# Patient Record
Sex: Female | Born: 1970 | Race: White | Hispanic: No | Smoking: Current every day smoker
Health system: Southern US, Community
[De-identification: ages and names within clinical notes are randomized; demographics above are authoritative.]

## PROBLEM LIST (undated history)

## (undated) DIAGNOSIS — F419 Anxiety disorder, unspecified: Secondary | ICD-10-CM

## (undated) DIAGNOSIS — M1990 Unspecified osteoarthritis, unspecified site: Secondary | ICD-10-CM

## (undated) DIAGNOSIS — F332 Major depressive disorder, recurrent severe without psychotic features: Secondary | ICD-10-CM

## (undated) DIAGNOSIS — A4902 Methicillin resistant Staphylococcus aureus infection, unspecified site: Secondary | ICD-10-CM

## (undated) DIAGNOSIS — F988 Other specified behavioral and emotional disorders with onset usually occurring in childhood and adolescence: Secondary | ICD-10-CM

## (undated) DIAGNOSIS — N891 Moderate vaginal dysplasia: Secondary | ICD-10-CM

## (undated) DIAGNOSIS — D649 Anemia, unspecified: Secondary | ICD-10-CM

## (undated) DIAGNOSIS — F329 Major depressive disorder, single episode, unspecified: Secondary | ICD-10-CM

## (undated) DIAGNOSIS — Z85828 Personal history of other malignant neoplasm of skin: Secondary | ICD-10-CM

## (undated) DIAGNOSIS — K589 Irritable bowel syndrome without diarrhea: Secondary | ICD-10-CM

## (undated) DIAGNOSIS — N879 Dysplasia of cervix uteri, unspecified: Secondary | ICD-10-CM

## (undated) DIAGNOSIS — F1020 Alcohol dependence, uncomplicated: Secondary | ICD-10-CM

## (undated) DIAGNOSIS — N907 Vulvar cyst: Secondary | ICD-10-CM

## (undated) DIAGNOSIS — K219 Gastro-esophageal reflux disease without esophagitis: Secondary | ICD-10-CM

## (undated) DIAGNOSIS — T7840XA Allergy, unspecified, initial encounter: Secondary | ICD-10-CM

## (undated) DIAGNOSIS — N611 Abscess of the breast and nipple: Secondary | ICD-10-CM

## (undated) DIAGNOSIS — F32A Depression, unspecified: Secondary | ICD-10-CM

## (undated) DIAGNOSIS — F4010 Social phobia, unspecified: Secondary | ICD-10-CM

## (undated) DIAGNOSIS — R45851 Suicidal ideations: Secondary | ICD-10-CM

## (undated) DIAGNOSIS — B001 Herpesviral vesicular dermatitis: Secondary | ICD-10-CM

## (undated) DIAGNOSIS — Z8589 Personal history of malignant neoplasm of other organs and systems: Secondary | ICD-10-CM

## (undated) DIAGNOSIS — I82409 Acute embolism and thrombosis of unspecified deep veins of unspecified lower extremity: Secondary | ICD-10-CM

## (undated) HISTORY — DX: Major depressive disorder, single episode, unspecified: F32.9

## (undated) HISTORY — DX: Suicidal ideations: R45.851

## (undated) HISTORY — DX: Other specified behavioral and emotional disorders with onset usually occurring in childhood and adolescence: F98.8

## (undated) HISTORY — DX: Herpesviral vesicular dermatitis: B00.1

## (undated) HISTORY — PX: SKIN CANCER EXCISION: SHX779

## (undated) HISTORY — DX: Social phobia, unspecified: F40.10

## (undated) HISTORY — DX: Depression, unspecified: F32.A

## (undated) HISTORY — DX: Dysplasia of cervix uteri, unspecified: N87.9

## (undated) HISTORY — DX: Abscess of the breast and nipple: N61.1

## (undated) HISTORY — DX: Moderate vaginal dysplasia: N89.1

## (undated) HISTORY — DX: Allergy, unspecified, initial encounter: T78.40XA

## (undated) HISTORY — DX: Anxiety disorder, unspecified: F41.9

## (undated) HISTORY — DX: Irritable bowel syndrome, unspecified: K58.9

## (undated) HISTORY — DX: Acute embolism and thrombosis of unspecified deep veins of unspecified lower extremity: I82.409

## (undated) HISTORY — DX: Alcohol dependence, uncomplicated: F10.20

## (undated) HISTORY — DX: Major depressive disorder, recurrent severe without psychotic features: F33.2

## (undated) HISTORY — PX: OTHER SURGICAL HISTORY: SHX169

## (undated) HISTORY — DX: Personal history of malignant neoplasm of other organs and systems: Z85.89

## (undated) HISTORY — PX: TUBAL LIGATION: SHX77

## (undated) HISTORY — DX: Vulvar cyst: N90.7

## (undated) HISTORY — PX: TONSILLECTOMY: SUR1361

## (undated) HISTORY — DX: Anemia, unspecified: D64.9

## (undated) HISTORY — DX: Personal history of other malignant neoplasm of skin: Z85.828

## (undated) HISTORY — PX: ABDOMINAL HYSTERECTOMY: SHX81

## (undated) HISTORY — PX: EYE SURGERY: SHX253

## (undated) HISTORY — DX: Unspecified osteoarthritis, unspecified site: M19.90

## (undated) HISTORY — DX: Gastro-esophageal reflux disease without esophagitis: K21.9

---

## 2011-02-23 ENCOUNTER — Emergency Department: Payer: Self-pay | Admitting: Emergency Medicine

## 2013-04-19 ENCOUNTER — Ambulatory Visit: Payer: Self-pay

## 2013-04-25 ENCOUNTER — Ambulatory Visit: Payer: Self-pay | Admitting: Obstetrics and Gynecology

## 2013-04-25 LAB — HEMOGLOBIN: HGB: 12.8 g/dL (ref 12.0–16.0)

## 2013-05-01 ENCOUNTER — Ambulatory Visit: Payer: Self-pay | Admitting: Obstetrics and Gynecology

## 2013-05-04 LAB — PATHOLOGY REPORT

## 2013-05-08 ENCOUNTER — Ambulatory Visit: Payer: Self-pay

## 2014-01-16 ENCOUNTER — Ambulatory Visit: Payer: Self-pay | Admitting: Obstetrics and Gynecology

## 2014-10-05 NOTE — Op Note (Signed)
PATIENT NAME:  Laura Lloyd, Laura Lloyd MR#:  [id number] DATE OF BIRTH:  [birth year]  DATE OF PROCEDURE:  05/01/2013  PREOPERATIVE DIAGNOSIS: Vulvar intraepithelial neoplasia, grade III.   POSTOPERATIVE DIAGNOSIS: Vulvar intraepithelial neoplasia, grade III.   PROCEDURE: Excisional biopsy of left vulva.  SURGEON: Jennell Cornerhomas Schermerhorn, MD  ANESTHESIA: General MAC.  INDICATIONS: This is a 44 year old gravida 1, para 1. 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   DESCRIPTION OF PROCEDURE: After adequate general MAC anesthesia, the patient was placed in the dorsal supine position with the legs in the candy-cane stirrups. Perineum was prepped with Betadine. The area in question was identified and demarcated with a marking pen. A wide excisional biopsy was performed. Tissue will be sent to pathology for identification. Subcutaneous tissues were reapproximated with 2-0 Vicryl suture, and the skin was reapproximated with subcuticular 4-0 Vicryl suture. Incision was injected with Marcaine, 0.5% Marcaine, and Steri-Strips were applied at the skin incision and Tegaderm placed. There were no complications. Minimal blood loss. The patient tolerated the procedure well.  ____________________________ Suzy Bouchardhomas J. Schermerhorn, MD tjs:sb D: 05/01/2013 09:58:28 ET T: 05/01/2013 10:16:04 ET JOB#: [id number]  cc: Suzy Bouchardhomas J. Schermerhorn, MD, <Dictator> Suzy BouchardHOMAS J SCHERMERHORN MD ELECTRONICALLY SIGNED 05/02/2013 9:33

## 2015-01-07 ENCOUNTER — Other Ambulatory Visit: Payer: Self-pay | Admitting: Orthopedic Surgery

## 2015-01-07 DIAGNOSIS — M62830 Muscle spasm of back: Secondary | ICD-10-CM

## 2015-01-07 DIAGNOSIS — M4726 Other spondylosis with radiculopathy, lumbar region: Secondary | ICD-10-CM

## 2015-01-10 ENCOUNTER — Ambulatory Visit
Admission: RE | Admit: 2015-01-10 | Discharge: 2015-01-10 | Disposition: A | Discharge status: Home / Self Care | Payer: BLUE CROSS/BLUE SHIELD | Source: Ambulatory Visit | Attending: Orthopedic Surgery | Admitting: Orthopedic Surgery

## 2015-01-10 DIAGNOSIS — M4726 Other spondylosis with radiculopathy, lumbar region: Secondary | ICD-10-CM

## 2015-01-10 DIAGNOSIS — M62830 Muscle spasm of back: Secondary | ICD-10-CM | POA: Diagnosis present

## 2015-04-29 ENCOUNTER — Other Ambulatory Visit: Payer: Self-pay | Admitting: Obstetrics and Gynecology

## 2015-04-29 DIAGNOSIS — R921 Mammographic calcification found on diagnostic imaging of breast: Secondary | ICD-10-CM

## 2015-05-16 ENCOUNTER — Ambulatory Visit
Admission: RE | Admit: 2015-05-16 | Discharge: 2015-05-16 | Disposition: A | Discharge status: Home / Self Care | Payer: BLUE CROSS/BLUE SHIELD | Source: Ambulatory Visit | Attending: Obstetrics and Gynecology | Admitting: Obstetrics and Gynecology

## 2015-05-16 DIAGNOSIS — R921 Mammographic calcification found on diagnostic imaging of breast: Secondary | ICD-10-CM

## 2016-01-23 ENCOUNTER — Emergency Department
Admission: EM | Admit: 2016-01-23 | Discharge: 2016-01-23 | Disposition: A | Discharge status: Other Acute Inpatient Hospital | Payer: BLUE CROSS/BLUE SHIELD | Attending: Emergency Medicine | Admitting: Emergency Medicine

## 2016-01-23 ENCOUNTER — Inpatient Hospital Stay
Admission: RE | Admit: 2016-01-23 | Discharge: 2016-01-25 | DRG: 885 | Disposition: A | Discharge status: Home / Self Care | Payer: BLUE CROSS/BLUE SHIELD | Source: Intra-hospital | Attending: Psychiatry | Admitting: Psychiatry

## 2016-01-23 DIAGNOSIS — Z5181 Encounter for therapeutic drug level monitoring: Secondary | ICD-10-CM | POA: Insufficient documentation

## 2016-01-23 DIAGNOSIS — F1010 Alcohol abuse, uncomplicated: Secondary | ICD-10-CM | POA: Diagnosis present

## 2016-01-23 DIAGNOSIS — K219 Gastro-esophageal reflux disease without esophagitis: Secondary | ICD-10-CM | POA: Diagnosis present

## 2016-01-23 DIAGNOSIS — F17210 Nicotine dependence, cigarettes, uncomplicated: Secondary | ICD-10-CM | POA: Insufficient documentation

## 2016-01-23 DIAGNOSIS — R45851 Suicidal ideations: Secondary | ICD-10-CM | POA: Insufficient documentation

## 2016-01-23 DIAGNOSIS — F4010 Social phobia, unspecified: Secondary | ICD-10-CM | POA: Diagnosis present

## 2016-01-23 DIAGNOSIS — F1020 Alcohol dependence, uncomplicated: Secondary | ICD-10-CM

## 2016-01-23 DIAGNOSIS — F410 Panic disorder [episodic paroxysmal anxiety] without agoraphobia: Secondary | ICD-10-CM | POA: Diagnosis present

## 2016-01-23 DIAGNOSIS — F332 Major depressive disorder, recurrent severe without psychotic features: Principal | ICD-10-CM | POA: Diagnosis present

## 2016-01-23 DIAGNOSIS — Y908 Blood alcohol level of 240 mg/100 ml or more: Secondary | ICD-10-CM | POA: Diagnosis present

## 2016-01-23 DIAGNOSIS — Z85828 Personal history of other malignant neoplasm of skin: Secondary | ICD-10-CM | POA: Diagnosis not present

## 2016-01-23 DIAGNOSIS — F329 Major depressive disorder, single episode, unspecified: Secondary | ICD-10-CM

## 2016-01-23 DIAGNOSIS — F10920 Alcohol use, unspecified with intoxication, uncomplicated: Secondary | ICD-10-CM | POA: Diagnosis not present

## 2016-01-23 DIAGNOSIS — Z818 Family history of other mental and behavioral disorders: Secondary | ICD-10-CM

## 2016-01-23 DIAGNOSIS — Z90710 Acquired absence of both cervix and uterus: Secondary | ICD-10-CM | POA: Diagnosis not present

## 2016-01-23 DIAGNOSIS — Z9889 Other specified postprocedural states: Secondary | ICD-10-CM

## 2016-01-23 DIAGNOSIS — F32A Depression, unspecified: Secondary | ICD-10-CM

## 2016-01-23 DIAGNOSIS — Z046 Encounter for general psychiatric examination, requested by authority: Secondary | ICD-10-CM | POA: Diagnosis present

## 2016-01-23 HISTORY — DX: Major depressive disorder, recurrent severe without psychotic features: F33.2

## 2016-01-23 HISTORY — DX: Alcohol dependence, uncomplicated: F10.20

## 2016-01-23 HISTORY — DX: Suicidal ideations: R45.851

## 2016-01-23 LAB — URINALYSIS COMPLETE WITH MICROSCOPIC (ARMC ONLY)
BILIRUBIN URINE: NEGATIVE
Bacteria, UA: NONE SEEN
GLUCOSE, UA: NEGATIVE mg/dL
Hgb urine dipstick: NEGATIVE
KETONES UR: NEGATIVE mg/dL
Leukocytes, UA: NEGATIVE
Nitrite: NEGATIVE
Protein, ur: NEGATIVE mg/dL
RBC / HPF: NONE SEEN RBC/hpf (ref 0–5)
SQUAMOUS EPITHELIAL / LPF: NONE SEEN
Specific Gravity, Urine: 1.001 — ABNORMAL LOW (ref 1.005–1.030)
WBC, UA: NONE SEEN WBC/hpf (ref 0–5)
pH: 6 (ref 5.0–8.0)

## 2016-01-23 LAB — COMPREHENSIVE METABOLIC PANEL
ALT: 26 U/L (ref 14–54)
AST: 32 U/L (ref 15–41)
Albumin: 4.5 g/dL (ref 3.5–5.0)
Alkaline Phosphatase: 52 U/L (ref 38–126)
Anion gap: 10 (ref 5–15)
BUN: 9 mg/dL (ref 6–20)
CHLORIDE: 101 mmol/L (ref 101–111)
CO2: 26 mmol/L (ref 22–32)
Calcium: 9.2 mg/dL (ref 8.9–10.3)
Creatinine, Ser: 0.48 mg/dL (ref 0.44–1.00)
Glucose, Bld: 107 mg/dL — ABNORMAL HIGH (ref 65–99)
POTASSIUM: 3.9 mmol/L (ref 3.5–5.1)
SODIUM: 137 mmol/L (ref 135–145)
Total Bilirubin: 0.9 mg/dL (ref 0.3–1.2)
Total Protein: 8 g/dL (ref 6.5–8.1)

## 2016-01-23 LAB — URINE DRUG SCREEN, QUALITATIVE (ARMC ONLY)
AMPHETAMINES, UR SCREEN: NOT DETECTED
Barbiturates, Ur Screen: NOT DETECTED
Benzodiazepine, Ur Scrn: NOT DETECTED
Cannabinoid 50 Ng, Ur ~~LOC~~: NOT DETECTED
Cocaine Metabolite,Ur ~~LOC~~: NOT DETECTED
MDMA (ECSTASY) UR SCREEN: NOT DETECTED
Methadone Scn, Ur: NOT DETECTED
OPIATE, UR SCREEN: NOT DETECTED
PHENCYCLIDINE (PCP) UR S: NOT DETECTED
Tricyclic, Ur Screen: NOT DETECTED

## 2016-01-23 LAB — CBC
HCT: 39.1 % (ref 35.0–47.0)
Hemoglobin: 13.8 g/dL (ref 12.0–16.0)
MCH: 33.2 pg (ref 26.0–34.0)
MCHC: 35.4 g/dL (ref 32.0–36.0)
MCV: 93.8 fL (ref 80.0–100.0)
PLATELETS: 298 10*3/uL (ref 150–440)
RBC: 4.17 MIL/uL (ref 3.80–5.20)
RDW: 13.2 % (ref 11.5–14.5)
WBC: 8.2 10*3/uL (ref 3.6–11.0)

## 2016-01-23 LAB — ETHANOL
ALCOHOL ETHYL (B): 260 mg/dL — AB (ref <5)
Alcohol, Ethyl (B): 143 mg/dL — ABNORMAL HIGH (ref <5)

## 2016-01-23 MED ORDER — ALUM & MAG HYDROXIDE-SIMETH 200-200-20 MG/5ML PO SUSP: 30.0000 mL | ORAL | Status: DC | PRN

## 2016-01-23 MED ORDER — ADULT MULTIVITAMIN W/MINERALS CH
1.0000 | ORAL_TABLET | Freq: Every day | ORAL | Status: DC
  Administered 2016-01-24 – 2016-01-25 (×2): 1 via ORAL
  Filled 2016-01-23 (×2): qty 1

## 2016-01-23 MED ORDER — LORAZEPAM 2 MG/ML IJ SOLN: 1.0000 mg | Freq: Four times a day (QID) | INTRAMUSCULAR | Status: DC | PRN

## 2016-01-23 MED ORDER — DULOXETINE HCL 60 MG PO CPEP
60.0000 mg | ORAL_CAPSULE | Freq: Every day | ORAL | Status: DC
  Administered 2016-01-23: 60 mg via ORAL
  Filled 2016-01-23: qty 1

## 2016-01-23 MED ORDER — LORAZEPAM 1 MG PO TABS: 1.0000 mg | ORAL_TABLET | Freq: Four times a day (QID) | ORAL | Status: DC | PRN

## 2016-01-23 MED ORDER — LORAZEPAM 1 MG PO TABS
1.0000 mg | ORAL_TABLET | Freq: Once | ORAL | Status: AC
  Administered 2016-01-23: 1 mg via ORAL
  Filled 2016-01-23: qty 1

## 2016-01-23 MED ORDER — VITAMIN B-1 100 MG PO TABS
100.0000 mg | ORAL_TABLET | Freq: Every day | ORAL | Status: DC
  Administered 2016-01-24: 100 mg via ORAL
  Filled 2016-01-23: qty 1

## 2016-01-23 MED ORDER — FOLIC ACID 1 MG PO TABS
1.0000 mg | ORAL_TABLET | Freq: Every day | ORAL | Status: DC
  Administered 2016-01-23: 1 mg via ORAL
  Filled 2016-01-23: qty 1

## 2016-01-23 MED ORDER — CHLORDIAZEPOXIDE HCL 25 MG PO CAPS
25.0000 mg | ORAL_CAPSULE | Freq: Four times a day (QID) | ORAL | Status: DC
  Administered 2016-01-23 – 2016-01-24 (×3): 25 mg via ORAL
  Filled 2016-01-23 (×3): qty 1

## 2016-01-23 MED ORDER — LORAZEPAM 1 MG PO TABS
1.0000 mg | ORAL_TABLET | Freq: Once | ORAL | Status: AC
  Administered 2016-01-23: 1 mg via ORAL

## 2016-01-23 MED ORDER — ESTRADIOL 1 MG PO TABS
1.0000 mg | ORAL_TABLET | Freq: Every day | ORAL | Status: DC
  Administered 2016-01-23: 1 mg via ORAL
  Filled 2016-01-23: qty 1

## 2016-01-23 MED ORDER — LORAZEPAM 1 MG PO TABS
ORAL_TABLET | ORAL | Status: AC
  Filled 2016-01-23: qty 1

## 2016-01-23 MED ORDER — MAGNESIUM HYDROXIDE 400 MG/5ML PO SUSP: 30.0000 mL | Freq: Every day | ORAL | Status: DC | PRN

## 2016-01-23 MED ORDER — THIAMINE HCL 100 MG/ML IJ SOLN: 100.0000 mg | Freq: Every day | INTRAMUSCULAR | Status: DC

## 2016-01-23 MED ORDER — ACETAMINOPHEN 325 MG PO TABS: 650.0000 mg | ORAL_TABLET | Freq: Four times a day (QID) | ORAL | Status: DC | PRN

## 2016-01-23 MED ORDER — ALPRAZOLAM 0.5 MG PO TABS: 0.5000 mg | ORAL_TABLET | Freq: Three times a day (TID) | ORAL | Status: DC | PRN

## 2016-01-23 MED ORDER — DULOXETINE HCL 60 MG PO CPEP
60.0000 mg | ORAL_CAPSULE | Freq: Every day | ORAL | Status: DC
  Administered 2016-01-24 – 2016-01-25 (×2): 60 mg via ORAL
  Filled 2016-01-23 (×2): qty 1

## 2016-01-23 MED ORDER — PANTOPRAZOLE SODIUM 40 MG PO TBEC
40.0000 mg | DELAYED_RELEASE_TABLET | Freq: Every day | ORAL | Status: DC
  Administered 2016-01-23: 40 mg via ORAL
  Filled 2016-01-23: qty 1

## 2016-01-23 MED ORDER — LORAZEPAM 1 MG PO TABS
1.0000 mg | ORAL_TABLET | Freq: Four times a day (QID) | ORAL | Status: DC | PRN
Start: 2016-01-23 — End: 2016-01-23

## 2016-01-23 MED ORDER — ESTRADIOL 1 MG PO TABS
1.0000 mg | ORAL_TABLET | Freq: Every day | ORAL | Status: DC
  Administered 2016-01-24 – 2016-01-25 (×2): 1 mg via ORAL
  Filled 2016-01-23 (×3): qty 1

## 2016-01-23 MED ORDER — ADULT MULTIVITAMIN W/MINERALS CH
1.0000 | ORAL_TABLET | Freq: Every day | ORAL | Status: DC
  Administered 2016-01-23: 1 via ORAL
  Filled 2016-01-23: qty 1

## 2016-01-23 MED ORDER — PANTOPRAZOLE SODIUM 40 MG PO TBEC
40.0000 mg | DELAYED_RELEASE_TABLET | Freq: Every day | ORAL | Status: DC
  Administered 2016-01-24 – 2016-01-25 (×2): 40 mg via ORAL
  Filled 2016-01-23 (×2): qty 1

## 2016-01-23 MED ORDER — THIAMINE HCL 100 MG/ML IJ SOLN
100.0000 mg | Freq: Every day | INTRAMUSCULAR | Status: DC
  Filled 2016-01-23: qty 2

## 2016-01-23 MED ORDER — VITAMIN B-1 100 MG PO TABS
100.0000 mg | ORAL_TABLET | Freq: Every day | ORAL | Status: DC
  Administered 2016-01-23: 100 mg via ORAL
  Filled 2016-01-23: qty 1

## 2016-01-23 MED ORDER — FOLIC ACID 1 MG PO TABS
1.0000 mg | ORAL_TABLET | Freq: Every day | ORAL | Status: DC
  Administered 2016-01-24 – 2016-01-25 (×2): 1 mg via ORAL
  Filled 2016-01-23 (×2): qty 1

## 2016-01-23 NOTE — ED Notes (Signed)
Breakfast was given to patient. 

## 2016-01-23 NOTE — ED Triage Notes (Signed)
Pt called pastor and voiced intent to harm herself, pt denies being SI or HI at this time.

## 2016-01-23 NOTE — ED Provider Notes (Signed)
[facility] Emergency Department Provider Note   ____________________________________________   First MD Initiated Contact with Patient 01/23/16 [phone]     (approximate)  I have reviewed the triage vital signs and the nursing notes.   HISTORY  Chief Complaint Medical Clearance    HPI Laura Griffinsracy C Lloyd is a 45 y.o. female who presents to the ED under IVC for depression with suicidal ideation. Patient has a history depression, called to speak with her pastor and voiced intended to harm herself. Reportedly she was found with a gun and brought in under involuntary commitment. Denies HI/AH/VH. Voices no medical complaints.   Past medical history Depression  There are no active problems to display for this patient.   No past surgical history on file.  Prior to Admission medications   Not on File    Allergies Review of patient's allergies indicates no known allergies.  No family history on file.  Social History Social History  Substance Use Topics  . Smoking status: Not on file  . Smokeless tobacco: Not on file  . Alcohol use Not on file  Smoker  Review of Systems  Constitutional: No fever/chills. Eyes: No visual changes. ENT: No sore throat. Cardiovascular: Denies chest pain. Respiratory: Denies shortness of breath. Gastrointestinal: No abdominal pain.  No nausea, no vomiting.  No diarrhea.  No constipation. Genitourinary: Negative for dysuria. Musculoskeletal: Negative for back pain. Skin: Negative for rash. Neurological: Negative for headaches, focal weakness or numbness. Psychiatric:Positive for depression with SI.  10-point ROS otherwise negative.  ____________________________________________   PHYSICAL EXAM:  VITAL SIGNS: ED Triage Vitals [01/23/16 0617]  Enc Vitals Group     BP (!) 142/98     Pulse Rate (!) 109     Resp 18     Temp 98.2 F (36.8 C)     Temp Source Oral     SpO2 100 %     Weight 150 lb (68 kg)   Height 5' (1.524 m)     Head Circumference      Peak Flow      Pain Score      Pain Loc      Pain Edu?      Excl. in GC?     Constitutional: Alert and oriented. Well appearing and in no acute distress. Eyes: Conjunctivae are normal. PERRL. EOMI. Head: Atraumatic. Nose: No congestion/rhinnorhea. Mouth/Throat: Mucous membranes are moist.  Oropharynx non-erythematous. Neck: No stridor.   Cardiovascular: Normal rate, regular rhythm. Grossly normal heart sounds.  Good peripheral circulation. Respiratory: Normal respiratory effort.  No retractions. Lungs CTAB. Gastrointestinal: Soft and nontender. No distention. No abdominal bruits. No CVA tenderness. Musculoskeletal: No lower extremity tenderness nor edema.  No joint effusions. Neurologic:  Normal speech and language. No gross focal neurologic deficits are appreciated. No gait instability. Skin:  Skin is warm, dry and intact. No rash noted. Psychiatric: Mood and affect are tearful. Speech and behavior are flat.  ____________________________________________   LABS (all labs ordered are listed, but only abnormal results are displayed)  Labs Reviewed  COMPREHENSIVE METABOLIC PANEL - Abnormal; Notable for the following:       Result Value   Glucose, Bld 107 (*)    All other components within normal limits  CBC  ETHANOL  URINALYSIS COMPLETEWITH MICROSCOPIC ([facility] ONLY)  URINE DRUG SCREEN, QUALITATIVE ([facility] ONLY)   ____________________________________________  EKG  None ____________________________________________  RADIOLOGY  None ____________________________________________   PROCEDURES  Procedure(s) performed: None  Procedures  Critical Care performed: No  ____________________________________________   INITIAL IMPRESSION / ASSESSMENT AND PLAN / ED COURSE  Pertinent labs & imaging results that were available during my care of the patient were reviewed by me and considered in my medical decision making (see chart  for details).  45 year old female with a history of depression brought in under involuntary commitment for suicidal ideation; access to handgun. Will keep patient under involuntary commitment pending TTS and psychiatry consults. Home medications ordered; 1 mg PO Ativan ordered for patient's tearfulness.  Clinical Course     ____________________________________________   FINAL CLINICAL IMPRESSION(S) / ED DIAGNOSES  Final diagnoses:  Depression  Suicidal ideation      NEW MEDICATIONS STARTED DURING THIS VISIT:  New Prescriptions   No medications on file     Note:  This document was prepared using Dragon voice recognition software and may include unintentional dictation errors.    Irean Hong, MD 01/23/16 [phone]

## 2016-01-23 NOTE — ED Notes (Signed)
Pt has nose ring, states that is does not come out

## 2016-01-23 NOTE — ED Notes (Signed)
Patient in restroom at this time.

## 2016-01-23 NOTE — ED Notes (Signed)
Patient noted sleeping in room. No complaints, stable, in no acute distress. Q15 minute rounds and monitoring via Security Cameras to continue.  

## 2016-01-23 NOTE — [facility] Counselor (Signed)
Per Dr. Toni Amendlapacs, pt meets criteria for inpatient hospitalization.  Pt has been assigned to bed 305-A.  Attending physician is Dr. Jennet MaduroPucilowska.

## 2016-01-23 NOTE — ED Notes (Signed)
Pt is aware she is being adm to [facility] later this afternoon

## 2016-01-23 NOTE — BH Assessment (Signed)
Assessment Note  Laura Lloyd is an 45 y.o. female presenting to the ED under IVC after calling her pastor and making statements about harming herself.  Pt denies SI or HI.  Pt stated she wanted to be left alone and did not wish to continue answering questions with this clinician.  Diagnosis: Depression  Past Medical History: No past medical history on file.  No past surgical history on file.  Family History: No family history on file.  Social History:  has no tobacco, alcohol, and drug history on file.  Additional Social History:  Alcohol / Drug Use History of alcohol / drug use?: No history of alcohol / drug abuse  CIWA: CIWA-Ar BP: (!) 142/98 Pulse Rate: (!) 109 COWS:    Allergies: No Known Allergies  Home Medications:  (Not in a hospital admission)  OB/GYN Status:  No LMP recorded. Patient has had a hysterectomy.  General Assessment Data [facility] Assessment: In system Is this a Tele or Face-to-Face Assessment?: Face-to-Face Is this an Initial Assessment or a Re-assessment for this encounter?: Initial Assessment Marital status: Single Maiden name: n/a Is patient pregnant?: No Pregnancy Status: No Living Arrangements: Alone Can pt return to current living arrangement?: Yes Admission Status: Involuntary Is patient capable of signing voluntary admission?: Yes Referral Source: Self/Family/Friend Insurance type: BCBS     Crisis Care Plan Living Arrangements: Alone Legal Guardian: Other: (self) Name of Psychiatrist: n/a Name of Therapist: n/a  Education Status Is patient currently in school?: No Current Grade: n/a Highest grade of school patient has completed: college Name of school: n/a Contact person: n/a  Risk to self with the past 6 months Suicidal Ideation: Yes-Currently Present Has patient been a risk to self within the past 6 months prior to admission? : No Suicidal Intent: No Has patient had any suicidal intent within  the past 6 months prior to admission? : No Is patient at risk for suicide?: Yes Suicidal Plan?: No Has patient had any suicidal plan within the past 6 months prior to admission? : No Access to Means: No What has been your use of drugs/alcohol within the last 12 months?: none reported Previous Attempts/Gestures: No Other Self Harm Risks: none identified Triggers for Past Attempts: None known Intentional Self Injurious Behavior: None Family Suicide History: Unknown Recent stressful life event(s): Other (Comment) Persecutory voices/beliefs?: No Depression: Yes Depression Symptoms: Tearfulness, Loss of interest in usual pleasures, Feeling worthless/self pity Substance abuse history and/or treatment for substance abuse?: No Suicide prevention information given to non-admitted patients: Not applicable  Risk to Others within the past 6 months Homicidal Ideation: No Does patient have any lifetime risk of violence toward others beyond the six months prior to admission? : No Thoughts of Harm to Others: No Current Homicidal Intent: No Current Homicidal Plan: No Access to Homicidal Means: No Identified Victim: n/a History of harm to others?: No Assessment of Violence: None Noted Violent Behavior Description: none identified Does patient have access to weapons?: No Criminal Charges Pending?: No Does patient have a court date: No Is patient on probation?: Unknown  Psychosis Hallucinations: None noted Delusions: None noted  Mental Status Report Appearance/Hygiene: In scrubs Eye Contact: Good Motor Activity: Agitation Speech: Logical/coherent Level of Consciousness: Crying Mood: Sad, Depressed Affect: Depressed, Sad Anxiety Level: Minimal Thought Processes: Relevant Judgement: Unable to Assess Orientation: Person, Place, Time, Situation Obsessive Compulsive Thoughts/Behaviors: Minimal  Cognitive Functioning Concentration: Good Memory: Recent Intact, Remote Intact IQ:  Average Insight: Fair Impulse Control: Fair  Appetite: Fair Weight Loss: 0 Weight Gain: 0 Sleep: Decreased Vegetative Symptoms: None  ADLScreening [facility] Assessment Services) Patient's cognitive ability adequate to safely complete daily activities?: Yes Patient able to express need for assistance with ADLs?: Yes Independently performs ADLs?: Yes (appropriate for developmental age)  Prior Inpatient Therapy Prior Inpatient Therapy: No Prior Therapy Dates: n/a Prior Therapy Facilty/Provider(s): n/a Reason for Treatment: n/a  Prior Outpatient Therapy Prior Outpatient Therapy: No Prior Therapy Dates: n/a Prior Therapy Facilty/Provider(s): n/a Reason for Treatment: n/a Does patient have an ACCT team?: No Does patient have Intensive In-House Services?  : No Does patient have [facility] services? : Unknown Does patient have P4CC services?: Unknown  ADL Screening (condition at time of admission) Patient's cognitive ability adequate to safely complete daily activities?: Yes Patient able to express need for assistance with ADLs?: Yes Independently performs ADLs?: Yes (appropriate for developmental age)       Abuse/Neglect Assessment (Assessment to be complete while patient is alone) Physical Abuse: Denies Verbal Abuse: Denies Sexual Abuse: Denies Exploitation of patient/patient's resources: Denies Self-Neglect: Denies Values / Beliefs Cultural Requests During Hospitalization: None Spiritual Requests During Hospitalization: None Consults Spiritual Care Consult Needed: No Social Work Consult Needed: No Merchant navy officer (For Healthcare) Does patient have an advance directive?: No Would patient like information on creating an advanced directive?: No - patient declined information    Additional Information 1:1 In Past 12 Months?: No CIRT Risk: No Elopement Risk: No Does patient have medical clearance?: Yes     Disposition:  Disposition Initial Assessment Completed for  this Encounter: Yes Disposition of Patient: Other dispositions Other disposition(s): Other (Comment) (Psych MD consult)  On Site Evaluation by:   Reviewed with Physician:    Leela Vanbrocklin C Yanett Conkright 01/23/2016 7:02 AM

## 2016-01-23 NOTE — ED Notes (Signed)
Report to include Situation, Background, Assessment, and Recommendations received from Ruth RN. Patient alert and oriented, warm and dry, in no acute distress. Patient denies SI, HI, AVH and pain. Patient made aware of Q15 minute rounds and security cameras for their safety. Patient instructed to come to me with needs or concerns. 

## 2016-01-23 NOTE — Consult Note (Signed)
[facility] Consult   Reason for Consult:  Consult for this 45 year old woman brought in under involuntary commitment after making suicidal statements Referring Physician:  Quentin Cornwall Patient Identification: Laura Lloyd MRN:  [id number] Principal Diagnosis: Severe recurrent major depression without psychotic features [facility]) Diagnosis:   Patient Active Problem List   Diagnosis Date Noted  . Severe recurrent major depression without psychotic features ([place]) [F33.2] 01/23/2016  . Alcohol abuse [F10.10] 01/23/2016  . Suicidal ideation [R45.851] 01/23/2016    Total Time spent with patient: 1 hour  Subjective:   Laura Lloyd is a 45 y.o. female patient admitted with "I just called my pastor".  HPI:  Patient interviewed. Chart reviewed. Labs and vitals reviewed. 45 year old woman was brought in under IVC after her pastor called 911 yesterday. Patient had called the pastor she says just seeking some counseling but admits that while talking to the pastor she had asked whether or not she would go to heaven if she were to kill her self. May have even made more direct comments than that. In any case the pastor was understandably concerned and called 911. Patient says that she's been very depressed and has been dealing with depression for years. Sounds like the symptoms may come and go but recently they've been worse. She feels tired and run down and negative all the time. Sleeps excessively. Doesn't eat in a consistent manner. Not taking care of herself very well. Has been having suicidal thoughts and feeling hopeless and negative about herself. She admits that she drinks too much. Yesterday she says she had about 6 shots. She says that she will drink maybe 5 days out of a week at a time but there are other times when she is not drinking steadily. Denies that she's using any other drugs. She does have a psychiatrist in [place] she sees who prescribes medication for her and she says that she  is compliant with medicine. Major stresses besides being out of work and idle and socially isolated are chronic conflict she has with her 14 38 year old sons.  Social history: Lives by herself. Sounds like she inherited some money which allows her to live without working but says she hasn't been able to work in years even though she has a degree in social work. She has 2 twin sons who are 71 or 6 years old who don't live with her anymore but she has chronic emotional conflict with them. Not in any kind of active relationship right now.  Medical history: Describes chronic back pain. Doesn't sound like she is on anything besides her Cymbalta to address the back pain. Also takes estrogen supplements because of symptomatic menopausal  Substance abuse history: Patient admits that she drinks too much. Denies ever having had a seizure from alcohol withdrawal. Has tried to engage in substance abuse treatment in the past but sounds like she hasn't really connected with 12-step groups. Nuys that she abuses any other drugs.  Past Psychiatric History: Has had one previous psychiatric hospitalization that she admits to about 20 years ago after the worth of her sons for depression. Denies ever having tried to kill her self in the past. Patient does have a gun at home and apparently she was either holding it or at least police found it nearby when they came to her home last night. Denies any psychotic symptoms.  Risk to Self: Suicidal Ideation: Yes-Currently Present Suicidal Intent: No Is patient at risk for suicide?: Yes Suicidal Plan?: No Access to Means: No  What has been your use of drugs/alcohol within the last 12 months?: none reported Other Self Harm Risks: none identified Triggers for Past Attempts: None known Intentional Self Injurious Behavior: None Risk to Others: Homicidal Ideation: No Thoughts of Harm to Others: No Current Homicidal Intent: No Current Homicidal Plan: No Access to Homicidal Means:  No Identified Victim: n/a History of harm to others?: No Assessment of Violence: None Noted Violent Behavior Description: none identified Does patient have access to weapons?: No Criminal Charges Pending?: No Does patient have a court date: No Prior Inpatient Therapy: Prior Inpatient Therapy: No Prior Therapy Dates: n/a Prior Therapy Facilty/Provider(s): n/a Reason for Treatment: n/a Prior Outpatient Therapy: Prior Outpatient Therapy: No Prior Therapy Dates: n/a Prior Therapy Facilty/Provider(s): n/a Reason for Treatment: n/a Does patient have an ACCT team?: No Does patient have Intensive In-House Services?  : No Does patient have [facility] services? : Unknown Does patient have P4CC services?: Unknown  Past Medical History: No past medical history on file. No past surgical history on file. Family History: No family history on file. Family Psychiatric  History: Patient's mother committed suicide Social History:  History  Alcohol use Not on file     History  Drug use: Unknown    Social History   Social History  . Marital status: Divorced    Spouse name: N/A  . Number of children: N/A  . Years of education: N/A   Social History Main Topics  . Smoking status: Not on file  . Smokeless tobacco: Not on file  . Alcohol use Not on file  . Drug use: Unknown  . Sexual activity: Not on file   Other Topics Concern  . Not on file   Social History Narrative  . No narrative on file   Additional Social History:    Allergies:  No Known Allergies  Labs:  Results for orders placed or performed during the hospital encounter of 01/23/16 (from the past 48 hour(s))  CBC     Status: None   Collection Time: 01/23/16  6:19 AM  Result Value Ref Range   WBC 8.2 3.6 - 11.0 K/uL   RBC 4.17 3.80 - 5.20 MIL/uL   Hemoglobin 13.8 12.0 - 16.0 g/dL   HCT 39.1 35.0 - 47.0 %   MCV 93.8 80.0 - 100.0 fL   MCH 33.2 26.0 - 34.0 pg   MCHC 35.4 32.0 - 36.0 g/dL   RDW 13.2 11.5 - 14.5 %    Platelets 298 150 - 440 K/uL  Comprehensive metabolic panel     Status: Abnormal   Collection Time: 01/23/16  6:19 AM  Result Value Ref Range   Sodium 137 135 - 145 mmol/L   Potassium 3.9 3.5 - 5.1 mmol/L   Chloride 101 101 - 111 mmol/L   CO2 26 22 - 32 mmol/L   Glucose, Bld 107 (H) 65 - 99 mg/dL   BUN 9 6 - 20 mg/dL   Creatinine, Ser 0.48 0.44 - 1.00 mg/dL   Calcium 9.2 8.9 - 10.3 mg/dL   Total Protein 8.0 6.5 - 8.1 g/dL   Albumin 4.5 3.5 - 5.0 g/dL   AST 32 15 - 41 U/L   ALT 26 14 - 54 U/L   Alkaline Phosphatase 52 38 - 126 U/L   Total Bilirubin 0.9 0.3 - 1.2 mg/dL   GFR calc non Af Amer >60 >60 mL/min   GFR calc Af Amer >60 >60 mL/min    Comment: (NOTE) The eGFR has been calculated  using the CKD EPI equation. This calculation has not been validated in all clinical situations. eGFR's persistently <60 mL/min signify possible Chronic Kidney Disease.    Anion gap 10 5 - 15  Ethanol     Status: Abnormal   Collection Time: 01/23/16  6:19 AM  Result Value Ref Range   Alcohol, Ethyl (B) 260 (H) <5 mg/dL    Comment:        LOWEST DETECTABLE LIMIT FOR SERUM ALCOHOL IS 5 mg/dL FOR MEDICAL PURPOSES ONLY   Urinalysis complete, with microscopic ([facility] only)     Status: Abnormal   Collection Time: 01/23/16  6:20 AM  Result Value Ref Range   Color, Urine COLORLESS (A) YELLOW   APPearance CLEAR (A) CLEAR   Glucose, UA NEGATIVE NEGATIVE mg/dL   Bilirubin Urine NEGATIVE NEGATIVE   Ketones, ur NEGATIVE NEGATIVE mg/dL   Specific Gravity, Urine 1.001 (L) 1.005 - 1.030   Hgb urine dipstick NEGATIVE NEGATIVE   pH 6.0 5.0 - 8.0   Protein, ur NEGATIVE NEGATIVE mg/dL   Nitrite NEGATIVE NEGATIVE   Leukocytes, UA NEGATIVE NEGATIVE   RBC / HPF NONE SEEN 0 - 5 RBC/hpf   WBC, UA NONE SEEN 0 - 5 WBC/hpf   Bacteria, UA NONE SEEN NONE SEEN   Squamous Epithelial / LPF NONE SEEN NONE SEEN  Urine Drug Screen, Qualitative ([facility] only)     Status: None   Collection Time: 01/23/16  6:20 AM  Result  Value Ref Range   Tricyclic, Ur Screen NONE DETECTED NONE DETECTED   Amphetamines, Ur Screen NONE DETECTED NONE DETECTED   MDMA (Ecstasy)Ur Screen NONE DETECTED NONE DETECTED   Cocaine Metabolite,Ur [place] NONE DETECTED NONE DETECTED   Opiate, Ur Screen NONE DETECTED NONE DETECTED   Phencyclidine (PCP) Ur S NONE DETECTED NONE DETECTED   Cannabinoid 50 Ng, Ur [place] NONE DETECTED NONE DETECTED   Barbiturates, Ur Screen NONE DETECTED NONE DETECTED   Benzodiazepine, Ur Scrn NONE DETECTED NONE DETECTED   Methadone Scn, Ur NONE DETECTED NONE DETECTED    Comment: (NOTE) 003  Tricyclics, urine               Cutoff 1000 ng/mL 200  Amphetamines, urine             Cutoff 1000 ng/mL 300  MDMA (Ecstasy), urine           Cutoff 500 ng/mL 400  Cocaine Metabolite, urine       Cutoff 300 ng/mL 500  Opiate, urine                   Cutoff 300 ng/mL 600  Phencyclidine (PCP), urine      Cutoff 25 ng/mL 700  Cannabinoid, urine              Cutoff 50 ng/mL 800  Barbiturates, urine             Cutoff 200 ng/mL 900  Benzodiazepine, urine           Cutoff 200 ng/mL 1000 Methadone, urine                Cutoff 300 ng/mL 1100 1200 The urine drug screen provides only a preliminary, unconfirmed 1300 analytical test result and should not be used for non-medical 1400 purposes. Clinical consideration and professional judgment should 1500 be applied to any positive drug screen result due to possible 1600 interfering substances. A more specific alternate chemical method 1700 must be used in order to obtain a  confirmed analytical result.  1800 Gas chromato graphy / mass spectrometry (GC/MS) is the preferred 1900 confirmatory method.   Ethanol     Status: Abnormal   Collection Time: 01/23/16 10:13 AM  Result Value Ref Range   Alcohol, Ethyl (B) 143 (H) <5 mg/dL    Comment:        LOWEST DETECTABLE LIMIT FOR SERUM ALCOHOL IS 5 mg/dL FOR MEDICAL PURPOSES ONLY     Current Facility-Administered Medications  Medication  Dose Route Frequency Provider Last Rate Last Dose  . DULoxetine (CYMBALTA) DR capsule 60 mg  60 mg Oral Daily Paulette Blanch, MD   60 mg at 01/23/16 1610  . pantoprazole (PROTONIX) EC tablet 40 mg  40 mg Oral Daily Paulette Blanch, MD   40 mg at 01/23/16 9604   No current outpatient prescriptions on file.    Musculoskeletal: Strength & Muscle Tone: decreased Gait & Station: unsteady Patient leans: N/A  Psychiatric Specialty Exam: Physical Exam  Nursing note and vitals reviewed. Constitutional: She appears well-developed and well-nourished.  HENT:  Head: Normocephalic and atraumatic.  Eyes: Conjunctivae are normal. Pupils are equal, round, and reactive to light.  Neck: Normal range of motion.  Cardiovascular: Regular rhythm and normal heart sounds.   Respiratory: Effort normal. No respiratory distress.  GI: Soft.  Musculoskeletal: Normal range of motion.  Neurological: She is alert.  Skin: Skin is warm and dry.  Psychiatric: Her mood appears anxious. Her speech is slurred. She is slowed. She expresses impulsivity. She exhibits a depressed mood. She expresses suicidal ideation. She expresses suicidal plans. She exhibits abnormal recent memory.    Review of Systems  Constitutional: Positive for malaise/fatigue.  HENT: Negative.   Eyes: Negative.   Respiratory: Negative.   Cardiovascular: Negative.   Gastrointestinal: Negative.   Musculoskeletal: Positive for back pain.  Skin: Negative.   Neurological: Negative.   Psychiatric/Behavioral: Positive for depression, memory loss, substance abuse and suicidal ideas. Negative for hallucinations. The patient is nervous/anxious. The patient does not have insomnia.     Blood pressure (!) 142/98, pulse (!) 109, temperature 98.2 F (36.8 C), temperature source Oral, resp. rate 18, height 5' (1.524 m), weight 68 kg (150 lb), SpO2 100 %.Body mass index is 29.29 kg/m.  General Appearance: Disheveled  Eye Contact:  Minimal  Speech:  Slurred   Volume:  Decreased  Mood:  Anxious and Depressed  Affect:  Depressed  Thought Process:  Disorganized  Orientation:  Full (Time, Place, and Person)  Thought Content:  Logical, Rumination and Tangential  Suicidal Thoughts:  Yes.  with intent/plan  Homicidal Thoughts:  No  Memory:  Immediate;   Good Recent;   Fair Remote;   Fair  Judgement:  Impaired  Insight:  Shallow  Psychomotor Activity:  Decreased  Concentration:  Concentration: Poor  Recall:  [employer] of Knowledge:  Fair  Language:  Good  Akathisia:  No  Handed:  Right  AIMS (if indicated):     Assets:  Desire for Improvement Housing Physical Health Resilience  ADL's:  Impaired  Cognition:  WNL  Sleep:        Treatment Plan Summary: Daily contact with patient to assess and evaluate symptoms and progress in treatment, Medication management and Plan This is a 45 year old woman who has major depression and alcohol abuse. Made suicidal statements yesterday. She is trying to minimize it a little bit today but she has to admit that she is very depressed and was having thoughts about death yesterday.  Despite having appropriate psychiatric care she is continuing to get worse. Came in with an elevated alcohol level which is still elevated this morning. Patient needs admission to the hospital because of suicidality. Discussed plan with her. I will continue her Cymbalta. When necessary Xanax as prescribed by her outpatient doctor. Continue the estrogen. She can have smoking replacement patch if she wants. Engage in groups and activities. 15 minute checks. Admission orders done. Patient will be monitored to see if there is any need for alcohol withdrawal treatment protocol.  Disposition: Recommend psychiatric Inpatient admission when medically cleared. Supportive therapy provided about ongoing stressors.  Alethia Berthold, MD 01/23/2016 11:47 AM

## 2016-01-23 NOTE — ED Notes (Signed)
Snack and beverage given. 

## 2016-01-23 NOTE — ED Notes (Signed)
Pt tearful, states that she is leaving and does not understand why she is here. Pt pulling at armband.

## 2016-01-23 NOTE — ED Notes (Signed)
Pt brought over from main ed. Pt is irritable but pleasant she is not happy about having to be adm. bhu process explained and adm process related to ivc. She asked me to call her family friends which are going to notify her sons of her whereabouts and gave them the code #. She states just give me something to knock me out" she appears in no acute distress

## 2016-01-24 DIAGNOSIS — F4010 Social phobia, unspecified: Secondary | ICD-10-CM | POA: Diagnosis present

## 2016-01-24 DIAGNOSIS — F332 Major depressive disorder, recurrent severe without psychotic features: Principal | ICD-10-CM

## 2016-01-24 HISTORY — DX: Social phobia, unspecified: F40.10

## 2016-01-24 MED ORDER — ALPRAZOLAM 0.5 MG PO TABS
0.5000 mg | ORAL_TABLET | Freq: Three times a day (TID) | ORAL | Status: DC | PRN
  Administered 2016-01-24: 0.5 mg via ORAL
  Filled 2016-01-24: qty 1

## 2016-01-24 NOTE — Plan of Care (Signed)
Problem: [facility] Participation in Recreation Therapeutic Interventions Goal: STG-Patient will demonstrate improved self esteem by identif STG: Self-Esteem - Within 3 treatment sessions, patient will verbalize at least 5 positive affirmation statements in one treatment session to increase self-esteem post d/c.  Outcome: Completed/Met Date Met: 01/24/16 Treatment Session 1; Completed 1 out of 1: At approximately 2:55 pm, LRT met with patient in consult room. Patient verbalized 5 positive affirmation statements. Patient reported it felt "like I don't believe myself yet". LRT encouraged patient to continue saying positive affirmation statements.  Leonette Monarch, LRT/CTRS 08.11.17 3:12 pm Goal: STG-Other Recreation Therapy Goal (Specify) STG: Stress Management - Within 3 treatment sessions, patient will verbalize understanding of the stress management techniques in one treatment session to increase stress management skills post d/c.  Outcome: Completed/Met Date Met: 01/24/16 Treatment Session 1; Completed 1 out of 1: At approximately 2:55 pm, LRT met with patient in consult room. LRT educated and provided patient with handouts on stress management techniques. Patient verbalized understanding. LRT encouraged patient to read over and practice the stress management techniques.  Leonette Monarch, LRT/CTRS 08.11.17 3:13 pm

## 2016-01-24 NOTE — Progress Notes (Signed)
D: Pt received from [facility]. Pt has 6 tattoos on bilateral forearms. No other skin issues or contraband found. Patient alert and oriented x4. Patient denies SI/HI/AVH. Pt affect is depressed and anxious. Pt irritable initially during skin check and initial part of assessment. Pt tearful multiple times during assessment. Pt very focused on relationship with her twin 47 yr old sons, and pt feels that "they don't care about me" and multiple similar statements. Pt also mentioned her financial status as a major stressor. Pt mentioned that before she came in she felt "everyone would be better off if I wasn't here." Pt  described being very upset that her pastor called the police.  A: Skin and contraband check performed with Sharrie Rothmanasey RN. Educated pt on unit policy. Reviewed admissiom material with pt. Oriented pt to unit.Offered active listening and support. Provided therapeutic communication. Administered scheduled medications.  R: Pt cooperative, and became more pleasant during assessment. Pt refused nicotine patch for the morning saying "it makes me feel really sick." Pt medication compliant. Will continue Q15 min. checks. Safety maintained.

## 2016-01-24 NOTE — Tx Team (Signed)
Initial Interdisciplinary Treatment Plan   PATIENT STRESSORS: Financial difficulties Marital or family conflict   PATIENT STRENGTHS: Ability for insight Active sense of humor Average or above average intelligence Capable of independent living Communication skills Physical Health   PROBLEM LIST: Problem List/Patient Goals Date to be addressed Date deferred Reason deferred Estimated date of resolution  Suicidal Ideation      Depression       Anxiety      "Work on getting back into social work."                                     DISCHARGE CRITERIA:  Improved stabilization in mood, thinking, and/or behavior  PRELIMINARY DISCHARGE PLAN: Outpatient therapy Participate in family therapy  PATIENT/FAMIILY INVOLVEMENT: This treatment plan has been presented to and reviewed with the patient, Laura Lloyd.  The patient and family have been given the opportunity to ask questions and make suggestions.  Anju Sereno B Polo Mcmartin 01/24/2016, 5:06 AM

## 2016-01-24 NOTE — [facility] Suicide Risk Assessment (Signed)
[facility] INPATIENT:  Family/Significant Other Suicide Prevention Education  Suicide Prevention Education:  Patient Refusal for Family/Significant Other Suicide Prevention Education: The patient Laura Lloyd has refused to provide written consent for family/significant other to be provided Family/Significant Other Suicide Prevention Education during admission and/or prior to discharge.  Physician notified. Pt stated that family does not care about her and does not want them involved at this time.  Lynden OxfordKadijah R Micaylah Bertucci, MSW, LCSW-A 01/24/2016, 2:02 PM

## 2016-01-24 NOTE — Progress Notes (Signed)
Recreation Therapy Notes  INPATIENT RECREATION THERAPY ASSESSMENT  Patient Details Name: Laura Lloyd MRN: [id number] DOB: 01/12/1971 Today's Date: 01/24/2016  Patient Stressors: Family, Relationship, Other (Comment) (Stressful relationship with brother; recent break-up 5 months ago; finances; feels lonely; back pain - put on weight)  Coping Skills:   Isolate, Avoidance, Art/Dance, Music, Other (Comment) (Sleeping, read, research, watch documentaries)  Personal Challenges: Concentration, Decision-Making, Problem-Solving, Relationships, Self-Esteem/Confidence, Social Interaction, Stress Management, Trusting Others  Leisure Interests (2+):  Individual - Other (Comment) (Go to museums, go out to eat)  Awareness of Community Resources:  Yes  Community Resources:  Gym, Other (Comment) ([place])  Current Use: Yes  If no, Barriers?:    Patient Strengths:  Nurturing, excellent cook  Patient Identified Areas of Improvement:  Get over social anxiety disorder and work towards more forgiveness of others that she has resentment for  Current Recreation Participation:  Nothing  Patient Goal for Hospitalization:  "To get some things set up to get this job thing going."  [place]idence:  [place]  County of Residence:  [place]   Current SI (including self-harm):  No  Current HI:  No  Consent to Intern Participation: N/A   Jacquelynn CreeGreene,Hayes Czaja M, LRT/CTRS 01/24/2016, 12:59 PM

## 2016-01-24 NOTE — [facility] Suicide Risk Assessment (Signed)
[facility]   Nursing information obtained from:  Patient Demographic factors:  Caucasian, Unemployed, Low socioeconomic status, Access to firearms Current Mental Status:  NA (denies) Loss Factors:  Decrease in vocational status, Financial problems / change in socioeconomic status Historical Factors:  Prior suicide attempts, Family history of suicide Risk Reduction Factors:  Sense of responsibility to family, Religious beliefs about death  Total Time spent with patient: 1 hour Principal Problem: Severe recurrent major depression without psychotic features (HCC) Diagnosis:   Patient Active Problem List   Diagnosis Date Noted  . Severe recurrent major depression without psychotic features (HCC) [F33.2] 01/23/2016    Priority: High  . Social anxiety disorder [F40.10] 01/24/2016  . Alcohol use disorder, moderate, dependence (HCC) [F10.20] 01/23/2016  . Suicidal ideation [R45.851] 01/23/2016   Subjective Data: Depression, anxiety, suicidal ideation.  Continued Clinical Symptoms:  Alcohol Use Disorder Identification Test Final Score (AUDIT): 15 The "Alcohol Use Disorders Identification Test", Guidelines for Use in Primary Care, Second Edition.  World Science writerHealth Organization [facility](WHO). Score between 0-7:  no or low risk or alcohol related problems. Score between 8-15:  moderate risk of alcohol related problems. Score between 16-19:  high risk of alcohol related problems. Score 20 or above:  warrants further diagnostic evaluation for alcohol dependence and treatment.   CLINICAL FACTORS:   Severe Anxiety and/or Agitation Depression:   Comorbid alcohol abuse/dependence Impulsivity Alcohol/Substance Abuse/Dependencies   Musculoskeletal: Strength & Muscle Tone: within normal limits Gait & Station: normal Patient leans: N/A  Psychiatric Specialty Exam: Physical Exam  Nursing note and vitals reviewed.   Review of Systems  Psychiatric/Behavioral: Positive for  depression, substance abuse and suicidal ideas. The patient is nervous/anxious.   All other systems reviewed and are negative.   Blood pressure 134/83, pulse 82, temperature 98.4 F (36.9 C), temperature source Oral, resp. rate 20, height 5\' 1"  (1.549 m), weight 72.6 kg (160 lb), SpO2 98 %.Body mass index is 30.23 kg/m.  General Appearance: Casual  Eye Contact:  Good  Speech:  Clear and Coherent  Volume:  Normal  Mood:  Anxious  Affect:  Appropriate  Thought Process:  Goal Directed  Orientation:  Full (Time, Place, and Person)  Thought Content:  WDL  Suicidal Thoughts:  Yes.  without intent/plan  Homicidal Thoughts:  No  Memory:  Immediate;   Fair Recent;   Fair Remote;   Fair  Judgement:  Fair  Insight:  Shallow  Psychomotor Activity:  Normal  Concentration:  Concentration: Fair and Attention Span: Fair  Recall:  FiservFair  Fund of Knowledge:  Fair  Language:  Fair  Akathisia:  No  Handed:  Right  AIMS (if indicated):     Assets:  Communication Skills Desire for Improvement Financial Resources/Insurance Housing Physical Health Resilience Social Support Transportation  ADL's:  Intact  Cognition:  WNL  Sleep:  Number of Hours: 4.25      COGNITIVE FEATURES THAT CONTRIBUTE TO RISK:  None    SUICIDE RISK:   Mild:  Suicidal ideation of limited frequency, intensity, duration, and specificity.  There are no identifiable plans, no associated intent, mild dysphoria and related symptoms, good self-control (both objective and subjective assessment), few other risk factors, and identifiable protective factors, including available and accessible social support.   PLAN OF CARE: Hospital admission, medication management, substance abuse counseling, vocational rehabilitation counseling, discharge planning.  Ms. Yetta BarreJones is a 45 year old female with a history of depression and anxiety admitted for suicidal ideation in the context of social  stressors.  1. Suicidal ideation. The patient  is able to contract for safety in the hospital.  2. Mood and anxiety. We will continue Cymbalta for depression and low dose Xanax for panic attacks.  3. Alcohol use. The patient denies excessive drinking. She denies any symptoms of withdrawal and refuses Librium today. Vital signs are stable.  4. Menopause. She is on Estrace.  5. GERD. She is on Protonix.  6. Disposition. She will be discharged to home. She will follow up with Dr. [place] Dragon in [place] for medication management. She will follow up with [facility] vocational rehabilitation program in [place].  I certify that inpatient services furnished can reasonably be expected to improve the patient's condition.  Kristine Linea, MD 01/24/2016, 2:31 PM

## 2016-01-24 NOTE — Tx Team (Addendum)
Interdisciplinary Treatment Plan Update (Adult)         Date: 01/24/2016   Time Reviewed: 10:30 AM   Progress in Treatment: Improving Attending groups: Yes  Participating in groups: Yes  Taking medication as prescribed: Yes  Tolerating medication: Yes  Family/Significant other contact made: CSW assessing proper contacts Patient understands diagnosis: Yes  Discussing patient identified problems/goals with staff: Yes  Medical problems stabilized or resolved: Yes  Denies suicidal/homicidal ideation: Yes  Issues/concerns per patient self-inventory: Yes  Other:   New problem(s) identified: N/A   Discharge Plan or Barriers: see below   Reason for Continuation of Hospitalization:   Depression   Anxiety   Medication Stabilization   Comments: N/A   Estimated length of stay: 3-5 days    Patient is a  45 year old female admitted for depression, SI and alcohol abuse. Patient lives in [place], [place]. Patient will benefit from crisis stabilization, medication evaluation, group therapy, and psycho education in addition to case management for discharge planning. Patient and CSW reviewed pt's identified goals and treatment plan. Pt verbalized understanding and agreed to treatment plan.    Review of initial/current patient goals per problem list:  1. Goal(s): Patient will participate in aftercare plan   Met: No  Target date:3-5 days post admission date   As evidenced by: Patient will participate within aftercare plan AEB aftercare provider and housing plan at discharge being identified.   CSW is assessing proper aftercare plans.  2. Goal (s): Patient will exhibit decreased depressive symptoms and suicidal ideations.   Met: No  Target date: 3-5 days post admission date   As evidenced by: Patient will utilize self-rating of depression at 3 or below and demonstrate decreased signs of depression or be deemed stable for discharge by MD.   Pt reports a depression score of 5 at this  time. Pt denies SI/HI.     3. Goal(s): Patient will demonstrate decreased signs and symptoms of anxiety.   Met: No  Target date: 3-5 days post admission date   As evidenced by: Patient will utilize self-rating of anxiety at 3 or below and demonstrated decreased signs of anxiety, or be deemed stable for discharge by MD   Pt reports an anxiety score of 5 at this time.   Attendees:  Patient: Laura Lloyd Family:  Physician: Orson Slick , MD   01/24/2016 10:30AM  Nursing: Floyde Parkins, RN     01/24/2016 10:30AM  Clinical Social Worker: Glorious Peach, [place]  01/24/2016 10:30AM  Other: Everitt Amber, Recreational Therapist  01/24/2016 10:30AM

## 2016-01-24 NOTE — Progress Notes (Signed)
Pt irritable most of a.m shift, but more pleasant in the afternoon. Denies SI, HI, AVH. Pt with several complaints today about food, and comforts. Pt states she is a "drama queen" and is angry that her children treat her like "shit" and will not even check on her. Encouragement and support offered. Encouraged patient to attend group, pt receptive and remains safe on unit with q 15 min checks.

## 2016-01-24 NOTE — [facility] Group Notes (Signed)
[facility] LCSW Group Therapy  01/24/2016 2:49 PM  Type of Therapy:  Group Therapy  Participation Level:  Pt did not attend group. CSW invited pt to group.   Summary of Progress/Problems: Feelings around Relapse. Group members discussed the meaning of relapse and shared personal stories of relapse, how it affected them and others, and how they perceived themselves during this time. Group members were encouraged to identify triggers, warning signs and coping skills used when facing the possibility of relapse. Social supports were discussed and explored in detail.    Sheran Newstrom G. Garnette CzechSampson MSW, LCSWA 01/24/2016, 2:50 PM

## 2016-01-24 NOTE — Plan of Care (Signed)
Problem: Safety: Goal: Periods of time without injury will increase Outcome: Progressing Pt has remained injury free while on the unit
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
## 2016-01-24 NOTE — Progress Notes (Signed)
Recreation Therapy Notes  Date: 08.11.17 Time: 1:00 pm Location: Craft Room  Group Topic: Coping Skills  Goal Area(s) Addresses:  Patient will participate in healthy coping skill. Patient will verbalize benefit of using art as a coping skill.  Behavioral Response: Did not attend  Intervention: Coloring  Activity: Patients were given coloring sheets to color and were instructed to think about the emotions they were feeling and what their minds were focused on.  Education: LRT educated patients on healthy coping skills.  Education Outcome: Patient did not attend group.   Clinical Observations/Feedback: Patient did not attend group.  Jacquelynn CreeGreene,Rogue Rafalski M, LRT/CTRS 01/24/2016 2:19 PM

## 2016-01-24 NOTE — H&P (Signed)
Psychiatric Admission Assessment Adult  Patient Identification: Laura Lloyd MRN:  [id number] Date of Evaluation:  01/24/2016 Chief Complaint:  Major Depressive Disorder Principal Diagnosis: Severe recurrent major depression without psychotic features ([place]) Diagnosis:   Patient Active Problem List   Diagnosis Date Noted  . Severe recurrent major depression without psychotic features ([place]) [F33.2] 01/23/2016    Priority: High  . Social anxiety disorder [F40.10] 01/24/2016  . Alcohol use disorder, moderate, dependence ([place]) [F10.20] 01/23/2016  . Suicidal ideation [R45.851] 01/23/2016   History of Present Illness:   Identifying data. Laura Lloyd is a 45 year old female with a history of depression and anxiety.  Chief complaint. "I am a drama queen."  History of present illness. Information was obtained from the patient and the chart. The patient has a long history of depression and anxiety and has been on medication since the age of 88. She is in the care of Dr. Robina Ade in [place] who prescribed Cymbalta for depression and low dose Xanax for anxiety with excellent results. On the day of admission the patient had an argument with her sons and became rather upset. She called her pastor who is also her friend and was advised to take a Xanax and go to bed. The patient would not let go and made some suicidal threats over the phone with her pastor who called the police. The patient is a described "drama queen". She adamantly denies any intention to hurt herself but admits that when upset suicidal thoughts crossed her mind. She is very afraid of medications as her mother attempted this suicide when the patient was young. She adamantly denies having any plans to hurt herself or others. She really does not report symptoms of depression but underscores her high anxiety that prevents her from participating in activities on the unit. She has to take Xanax before going to church. She is afraid of driving as  well. 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 In addition to social anxiety she endorses infrequent panic attacks. She adamantly denies any psychotic symptoms or symptoms suggestive of bipolar mania. She drinks infrequently and does not believe she has a problem but on the day of admission she had a few drinks. Her blood alcohol level was 260. She does not believe she needs detox. She denies illicit substance or prescription pill abuse. Indeed she was negative for substances on admission.   Past psychiatric history. She was hospitalized once at the age of 71 after she delivered her twin boys. She denies it was postpartum depression but rather introspective exhaustion. She denies ever attempting suicide.  Family psychiatric history. Mother with depression and anxiety who attempted suicide.    Social history. She is an empty nester living with. Her twin boys moved out of the house. There is ongoing conflict. She inherited money from her parents and is independently wealthy. She is trained as a Education officer, museum but has not been able to work in part due to social anxiety in part due to back problems. She enjoys being a TEFL teacher.   Total Time spent with patient: 1 hour  Is the patient at risk to self? Yes.    Has the patient been a risk to self in the past 6 months? No.  Has the patient been a risk to self within the distant past? No.  Is the patient a risk to others? No.  Has the patient been a risk to others in the past 6 months? No.  Has the patient been a risk to others within the distant past? No.   Prior Inpatient Therapy:   Prior Outpatient Therapy:    Alcohol Screening: 1. How often do you have a drink containing alcohol?: 2 to 3 times a week 2. How many drinks containing alcohol do you have on a typical day when you are drinking?: 5 or 6 3. How often do you have six or more drinks on one  occasion?: Weekly Preliminary Score: 5 4. How often during the last year have you found that you were not able to stop drinking once you had started?: Less than monthly 5. How often during the last year have you failed to do what was normally expected from you becasue of drinking?: Less than monthly 6. How often during the last year have you needed a first drink in the morning to get yourself going after a heavy drinking session?: Never 7. How often during the last year have you had a feeling of guilt of remorse after drinking?: Never 8. How often during the last year have you been unable to remember what happened the night before because you had been drinking?: Less than monthly 9. Have you or someone else been injured as a result of your drinking?: Yes, during the last year 10. Has a relative or friend or a doctor or another health worker been concerned about your drinking or suggested you cut down?: No Alcohol Use Disorder Identification Test Final Score (AUDIT): 15 Brief Intervention: Yes Substance Abuse History in the last 12 months:  Yes.   Consequences of Substance Abuse: Negative Previous Psychotropic Medications: Yes  Psychological Evaluations: No  Past Medical History: History reviewed. No pertinent past medical history.  Past Surgical History:  Procedure Laterality Date  . ABDOMINAL HYSTERECTOMY    . SKIN CANCER EXCISION     Pt indicated it was removed from her buttocks  . TONSILLECTOMY     Family History: History reviewed. No pertinent family history.  Tobacco Screening: Have you used any form of tobacco in the last 30 days? (Cigarettes, Smokeless Tobacco, Cigars, and/or Pipes): Yes Tobacco use, Select all that apply: 5 or more cigarettes per day Are you interested in Tobacco Cessation Medications?: No, patient refused Counseled patient on smoking cessation including recognizing danger situations, developing coping skills and basic information about quitting provided:  Yes Social History:  History  Alcohol use Not on file     History  Drug use: Unknown    Additional Social History: Marital status: Divorced Divorced, when?: 23 years - since 2000 What types of issues is patient dealing with in the relationship?: Mutual agreement to separate  Are you sexually active?: No What is your sexual orientation?: Straight  Has your sexual activity been affected by drugs, alcohol, medication, or emotional stress?: N/A Does patient have children?: Yes How many children?: 2 How is patient's relationship with their children?: Kids are her pride and joy - can feel that she is overwhelming                          Allergies:  No Known Allergies Lab Results:  Results for orders placed or performed during the hospital encounter of 01/23/16 (from the past 48 hour(s))  CBC     Status: None   Collection Time: 01/23/16  6:19 AM  Result Value Ref Range   WBC 8.2 3.6 -  11.0 K/uL   RBC 4.17 3.80 - 5.20 MIL/uL   Hemoglobin 13.8 12.0 - 16.0 g/dL   HCT 39.1 35.0 - 47.0 %   MCV 93.8 80.0 - 100.0 fL   MCH 33.2 26.0 - 34.0 pg   MCHC 35.4 32.0 - 36.0 g/dL   RDW 13.2 11.5 - 14.5 %   Platelets 298 150 - 440 K/uL  Comprehensive metabolic panel     Status: Abnormal   Collection Time: 01/23/16  6:19 AM  Result Value Ref Range   Sodium 137 135 - 145 mmol/L   Potassium 3.9 3.5 - 5.1 mmol/L   Chloride 101 101 - 111 mmol/L   CO2 26 22 - 32 mmol/L   Glucose, Bld 107 (H) 65 - 99 mg/dL   BUN 9 6 - 20 mg/dL   Creatinine, Ser 0.48 0.44 - 1.00 mg/dL   Calcium 9.2 8.9 - 10.3 mg/dL   Total Protein 8.0 6.5 - 8.1 g/dL   Albumin 4.5 3.5 - 5.0 g/dL   AST 32 15 - 41 U/L   ALT 26 14 - 54 U/L   Alkaline Phosphatase 52 38 - 126 U/L   Total Bilirubin 0.9 0.3 - 1.2 mg/dL   GFR calc non Af Amer >60 >60 mL/min   GFR calc Af Amer >60 >60 mL/min    Comment: (NOTE) The eGFR has been calculated using the CKD EPI equation. This calculation has not been validated in all clinical  situations. eGFR's persistently <60 mL/min signify possible Chronic Kidney Disease.    Anion gap 10 5 - 15  Ethanol     Status: Abnormal   Collection Time: 01/23/16  6:19 AM  Result Value Ref Range   Alcohol, Ethyl (B) 260 (H) <5 mg/dL    Comment:        LOWEST DETECTABLE LIMIT FOR SERUM ALCOHOL IS 5 mg/dL FOR MEDICAL PURPOSES ONLY   Urinalysis complete, with microscopic ([facility] only)     Status: Abnormal   Collection Time: 01/23/16  6:20 AM  Result Value Ref Range   Color, Urine COLORLESS (A) YELLOW   APPearance CLEAR (A) CLEAR   Glucose, UA NEGATIVE NEGATIVE mg/dL   Bilirubin Urine NEGATIVE NEGATIVE   Ketones, ur NEGATIVE NEGATIVE mg/dL   Specific Gravity, Urine 1.001 (L) 1.005 - 1.030   Hgb urine dipstick NEGATIVE NEGATIVE   pH 6.0 5.0 - 8.0   Protein, ur NEGATIVE NEGATIVE mg/dL   Nitrite NEGATIVE NEGATIVE   Leukocytes, UA NEGATIVE NEGATIVE   RBC / HPF NONE SEEN 0 - 5 RBC/hpf   WBC, UA NONE SEEN 0 - 5 WBC/hpf   Bacteria, UA NONE SEEN NONE SEEN   Squamous Epithelial / LPF NONE SEEN NONE SEEN  Urine Drug Screen, Qualitative ([facility] only)     Status: None   Collection Time: 01/23/16  6:20 AM  Result Value Ref Range   Tricyclic, Ur Screen NONE DETECTED NONE DETECTED   Amphetamines, Ur Screen NONE DETECTED NONE DETECTED   MDMA (Ecstasy)Ur Screen NONE DETECTED NONE DETECTED   Cocaine Metabolite,Ur [place] NONE DETECTED NONE DETECTED   Opiate, Ur Screen NONE DETECTED NONE DETECTED   Phencyclidine (PCP) Ur S NONE DETECTED NONE DETECTED   Cannabinoid 50 Ng, Ur [place] NONE DETECTED NONE DETECTED   Barbiturates, Ur Screen NONE DETECTED NONE DETECTED   Benzodiazepine, Ur Scrn NONE DETECTED NONE DETECTED   Methadone Scn, Ur NONE DETECTED NONE DETECTED    Comment: (NOTE) 536  Tricyclics, urine  Cutoff 1000 ng/mL 200  Amphetamines, urine             Cutoff 1000 ng/mL 300  MDMA (Ecstasy), urine           Cutoff 500 ng/mL 400  Cocaine Metabolite, urine       Cutoff 300 ng/mL 500   Opiate, urine                   Cutoff 300 ng/mL 600  Phencyclidine (PCP), urine      Cutoff 25 ng/mL 700  Cannabinoid, urine              Cutoff 50 ng/mL 800  Barbiturates, urine             Cutoff 200 ng/mL 900  Benzodiazepine, urine           Cutoff 200 ng/mL 1000 Methadone, urine                Cutoff 300 ng/mL 1100 1200 The urine drug screen provides only a preliminary, unconfirmed 1300 analytical test result and should not be used for non-medical 1400 purposes. Clinical consideration and professional judgment should 1500 be applied to any positive drug screen result due to possible 1600 interfering substances. A more specific alternate chemical method 1700 must be used in order to obtain a confirmed analytical result.  1800 Gas chromato graphy / mass spectrometry (GC/MS) is the preferred 1900 confirmatory method.   Ethanol     Status: Abnormal   Collection Time: 01/23/16 10:13 AM  Result Value Ref Range   Alcohol, Ethyl (B) 143 (H) <5 mg/dL    Comment:        LOWEST DETECTABLE LIMIT FOR SERUM ALCOHOL IS 5 mg/dL FOR MEDICAL PURPOSES ONLY     Blood Alcohol level:  Lab Results  Component Value Date   ETH 143 (H) 01/23/2016   ETH 260 (H) 28/31/5176    Metabolic Disorder Labs:  No results found for: HGBA1C, MPG No results found for: PROLACTIN No results found for: CHOL, TRIG, HDL, CHOLHDL, VLDL, LDLCALC  Current Medications: Current Facility-Administered Medications  Medication Dose Route Frequency Provider Last Rate Last Dose  . acetaminophen (TYLENOL) tablet 650 mg  650 mg Oral Q6H PRN Gonzella Lex, MD      . alum & mag hydroxide-simeth (MAALOX/MYLANTA) 200-200-20 MG/5ML suspension 30 mL  30 mL Oral Q4H PRN Gonzella Lex, MD      . chlordiazePOXIDE (LIBRIUM) capsule 25 mg  25 mg Oral QID Terris Germano B Shailene Demonbreun, MD   25 mg at 01/24/16 1408  . DULoxetine (CYMBALTA) DR capsule 60 mg  60 mg Oral Daily Gonzella Lex, MD   60 mg at 01/24/16 0937  . estradiol (ESTRACE)  tablet 1 mg  1 mg Oral Daily Gonzella Lex, MD   1 mg at 01/24/16 0937  . folic acid (FOLVITE) tablet 1 mg  1 mg Oral Daily Gonzella Lex, MD   1 mg at 01/24/16 1607  . magnesium hydroxide (MILK OF MAGNESIA) suspension 30 mL  30 mL Oral Daily PRN Gonzella Lex, MD      . multivitamin with minerals tablet 1 tablet  1 tablet Oral Daily Gonzella Lex, MD   1 tablet at 01/24/16 0937  . pantoprazole (PROTONIX) EC tablet 40 mg  40 mg Oral Daily Gonzella Lex, MD   40 mg at 01/24/16 3710   PTA Medications: No prescriptions prior to admission.    Musculoskeletal: Strength &  Muscle Tone: within normal limits Gait & Station: normal Patient leans: N/A  Psychiatric Specialty Exam: I reviewed physical exam performed in the emergency room and agree with the findings. Physical Exam  Nursing note and vitals reviewed.   Review of Systems  Psychiatric/Behavioral: Positive for depression, substance abuse and suicidal ideas. The patient is nervous/anxious.   All other systems reviewed and are negative.   Blood pressure 134/83, pulse 82, temperature 98.4 F (36.9 C), temperature source Oral, resp. rate 20, height 5' 1"  (1.549 m), weight 72.6 kg (160 lb), SpO2 98 %.Body mass index is 30.23 kg/m.   See SRA.                                                  Sleep:  Number of Hours: 4.25       Treatment Plan Summary: Daily contact with patient to assess and evaluate symptoms and progress in treatment and Medication management   Ms. Pendergraph is a 45 year old female with a history of depression and anxiety admitted for suicidal ideation in the context of social stressors.  1. Suicidal ideation. The patient is able to contract for safety in the hospital.  2. Mood and anxiety. We will continue Cymbalta for depression and low dose Xanax for panic attacks.  3. Alcohol use. The patient denies excessive drinking. She denies any symptoms of withdrawal and refuses Librium today.  Vital signs are stable.  4. Menopause. She is on Estrace.  5. GERD. She is on Protonix.  6. Disposition. She will be discharged to home. She will follow up with Dr. Robina Ade in [place] for medication management. She will follow up with [facility] rehabilitation program in [place].   Observation Level/Precautions:  15 minute checks  Laboratory:  CBC Chemistry Profile UDS UA  Psychotherapy:    Medications:    Consultations:    Discharge Concerns:    Estimated LOS:  Other:     I certify that inpatient services furnished can reasonably be expected to improve the patient's condition.    Orson Slick, MD 8/11/20172:37 PM

## 2016-01-24 NOTE — [facility] Group Notes (Signed)
[facility] Group Notes:  (Nursing/MHT/Case Management/Adjunct)  Date:  01/24/2016  Time:  3:50 PM  Type of Therapy:  Psychoeducational Skills  Participation Level:  Did Not Attend   Lynelle SmokeCara Travis [facility] 01/24/2016, 3:50 PM

## 2016-01-25 MED ORDER — DULOXETINE HCL 60 MG PO CPEP: 60.0000 mg | ORAL_CAPSULE | Freq: Every day | ORAL | 1 refills | Status: DC

## 2016-01-25 MED ORDER — PANTOPRAZOLE SODIUM 40 MG PO TBEC: 40.0000 mg | DELAYED_RELEASE_TABLET | Freq: Every day | ORAL | 1 refills | Status: DC

## 2016-01-25 MED ORDER — ESTRADIOL 1 MG PO TABS: 1.0000 mg | ORAL_TABLET | Freq: Every day | ORAL | 1 refills | Status: DC

## 2016-01-25 MED ORDER — FOLIC ACID 1 MG PO TABS: 1.0000 mg | ORAL_TABLET | Freq: Every day | ORAL | 1 refills | Status: DC

## 2016-01-25 NOTE — [facility] Group Notes (Signed)
[facility] Group Notes:  (Nursing/MHT/Case Management/Adjunct)  Date:  01/25/2016  Time:  11:07 AM  Type of Therapy:  goal setting  Participation Level:  Active  Participation Quality:  Appropriate  Affect:  Appropriate  Cognitive:  Appropriate  Insight:  Appropriate  Engagement in Group:  Engaged  Modes of Intervention:  goal setting  Summary of Progress/Problems:  Laura Lloyd Laura Lloyd 01/25/2016, 11:07 AM

## 2016-01-25 NOTE — [facility] Group Notes (Deleted)
[facility] Group Notes:  (Nursing/MHT/Case Management/Adjunct)  Date:  01/25/2016  Time:  11:07 AM  Type of Therapy:  goal setting   Participation Level:  Did Not Attend  Twanna Hymanda C Elby Blackwelder 01/25/2016, 11:07 AM

## 2016-01-25 NOTE — Discharge Summary (Signed)
Physician Discharge Summary Note  Patient:  Laura Lloyd is an 45 y.o., female MRN:  [id number] DOB:  August 14, 1970 Patient phone:  [phone] (home)  Patient address:   [address] [place] [address],  Total Time spent with patient: 30 minutes  Date of Admission:  01/23/2016 Date of Discharge: 01/25/2016  Reason for Admission:  Patient was admitted to the hospital because of suicidal ideation in the context of depression and alcohol abuse. Was intoxicated and needed brief withdrawal as well as still emotionally unstable.  Principal Problem: Severe recurrent major depression without psychotic features [facility]) Discharge Diagnoses: Patient Active Problem List   Diagnosis Date Noted  . Social anxiety disorder [F40.10] 01/24/2016  . Severe recurrent major depression without psychotic features ([facility]) [F33.2] 01/23/2016  . Alcohol use disorder, moderate, dependence ([facility]) [F10.20] 01/23/2016  . Suicidal ideation [R45.851] 01/23/2016    Past Psychiatric History: Past history of alcohol abuse chronic depression and chronic anxiety disorders.  Past Medical History: History reviewed. No pertinent past medical history.  Past Surgical History:  Procedure Laterality Date  . ABDOMINAL HYSTERECTOMY    . SKIN CANCER EXCISION     Pt indicated it was removed from her buttocks  . TONSILLECTOMY     Family History: History reviewed. No pertinent family history. Family Psychiatric  History: Family history positive for alcohol abuse Social History:  History  Alcohol use Not on file     History  Drug use: Unknown    Social History   Social History  . Marital status: Divorced    Spouse name: N/A  . Number of children: N/A  . Years of education: N/A   Social History Main Topics  . Smoking status: Current Every Day Smoker    Packs/day: 1.00    Types: Cigarettes  . Smokeless tobacco: Never Used  . Alcohol use None  . Drug use: Unknown  . Sexual activity: Not Asked   Other Topics  Concern  . None   Social History Narrative  . None    Hospital Course:  Patient was treated with alcohol detox protocol which she tolerated well. No seizures no sign of delirium. Minimal need for detox medicine. She was evaluated individually and in groups and participated appropriately in therapy. Medications were continued including her usual Cymbalta. Patient has shown improvement in her mood. Consistently denies suicidal ideation. Able to describe a more positive plan for the future. Very agreeable to follow up with her normal psychiatrist. At this point she is physically stable and not showing any signs of acute dangerousness.  Physical Findings: AIMS:  , ,  ,  ,    CIWA:  CIWA-Ar Total: 3 COWS:     Musculoskeletal: Strength & Muscle Tone: within normal limits Gait & Station: normal Patient leans: N/A  Psychiatric Specialty Exam: Physical Exam  Nursing note and vitals reviewed. Constitutional: She appears well-developed and well-nourished.  HENT:  Head: Normocephalic and atraumatic.  Eyes: Conjunctivae are normal. Pupils are equal, round, and reactive to light.  Neck: Normal range of motion.  Cardiovascular: Regular rhythm and normal heart sounds.   Respiratory: Effort normal. No respiratory distress.  GI: Soft.  Musculoskeletal: Normal range of motion.  Neurological: She is alert.  Skin: Skin is warm and dry.  Psychiatric: She has a normal mood and affect. Her behavior is normal. Judgment and thought content normal.    Review of Systems  Constitutional: Negative.   HENT: Negative.   Eyes: Negative.   Respiratory: Negative.   Cardiovascular:  Negative.   Gastrointestinal: Negative.   Musculoskeletal: Negative.   Skin: Negative.   Neurological: Negative.   Psychiatric/Behavioral: Positive for substance abuse. Negative for depression, hallucinations, memory loss and suicidal ideas. The patient is not nervous/anxious and does not have insomnia.     Blood pressure  119/70, pulse 82, temperature 97.9 F (36.6 C), temperature source Oral, resp. rate 18, height  (1.549 m), weight 72.6 kg (160 lb), SpO2 98 %.Body mass index is 30.23 kg/m.  General Appearance: Casual  Eye Contact:  Good  Speech:  Clear and Coherent  Volume:  Normal  Mood:  Euthymic  Affect:  Congruent  Thought Process:  Goal Directed  Orientation:  Full (Time, Place, and Person)  Thought Content:  Logical  Suicidal Thoughts:  No  Homicidal Thoughts:  No  Memory:  Immediate;   Good Recent;   Poor Remote;   Good  Judgement:  Fair  Insight:  Fair  Psychomotor Activity:  Normal  Concentration:  Concentration: Fair  Recall:  Fair  Fund of Knowledge:  Fair  Language:  Fair  Akathisia:  No  Handed:  Right  AIMS (if indicated):     Assets:  Communication Skills Desire for Improvement Housing Physical Health Resilience Social Support  ADL's:  Intact  Cognition:  WNL  Sleep:  Number of Hours: 7     Have you used any form of tobacco in the last 30 days? (Cigarettes, Smokeless Tobacco, Cigars, and/or Pipes): Yes  Has this patient used any form of tobacco in the last 30 days? (Cigarettes, Smokeless Tobacco, Cigars, and/or Pipes) Yes, No  Blood Alcohol level:  Lab Results  Component Value Date   ETH 143 (H) 01/23/2016   ETH 260 (H) 01/23/2016    Metabolic Disorder Labs:  No results found for: HGBA1C, MPG No results found for: PROLACTIN No results found for: CHOL, TRIG, HDL, CHOLHDL, VLDL, LDLCALC  See Psychiatric Specialty Exam and Suicide Risk Assessment completed by Attending Physician prior to discharge.  Discharge destination:  Home  Is patient on multiple antipsychotic therapies at discharge:  No   Has Patient had three or more failed trials of antipsychotic monotherapy by history:  No  Recommended Plan for Multiple Antipsychotic Therapies: NA  Discharge Instructions    Diet - low sodium heart healthy    Complete by:  As directed   Increase activity  slowly    Complete by:  As directed       Medication List    TAKE these medications     Indication  DULoxetine 60 MG capsule Commonly known as:  CYMBALTA Take 1 capsule (60 mg total) by mouth daily.  Indication:  Major Depressive Disorder   estradiol 1 MG tablet Commonly known as:  ESTRACE Take 1 tablet (1 mg total) by mouth daily.  Indication:  Deficiency of the Hormone Estrogen   folic acid 1 MG tablet Commonly known as:  FOLVITE Take 1 tablet (1 mg total) by mouth daily.  Indication:  Anemia From Inadequate Folic Acid   pantoprazole 40 MG tablet Commonly known as:  PROTONIX Take 1 tablet (40 mg total) by mouth daily.  Indication:  Gastroesophageal Reflux Disease      Follow-up Information    [facility]. Go on 01/28/2016.   Why:  Please arrive to your hospital follow-up appointment with your psychiatrist Dr. Evelene Croon for medication managment. Your appointment is August 15th at 10:30AM, please arrive 15 minutes early for prompt services. Bring discharge paperwork and medications. Contact information:  [address], [place], [place] [address] Hours: 8:30AM-6:30PM Phone: [phone] Fax: [phone]          Follow-up recommendations:  Activity:  Activity as tolerated Diet:  Heart healthy diet Other:  Follow-up with outpatient psychiatrist. Discontinue alcohol abuse  Comments: patient interviewed. Chart reviewed. Patient is not reporting any current suicidal ideation. Affect and thoughts are clear. She was counseled about the importance of staying off of alcohol. She will be discharged today on her current medicine. Prescriptions done. Follow-up with her regular psychiatrist. Signed: Mordecai RasmussenJohn Clover Feehan, MD 01/25/2016, 3:41 PM

## 2016-01-25 NOTE — Tx Team (Signed)
Interdisciplinary Treatment Plan Update (Adult)  Date:  01/25/2016 Time Reviewed:  3:28 PM  Progress in Treatment: Attending groups: Yes. Participating in groups:  Yes. Taking medication as prescribed:  Yes. Tolerating medication:  Yes. Family/Significant othe contact made:  No, Patient refused Patient understands diagnosis:  Yes. Discussing patient identified problems/goals with staff:  Yes. Medical problems stabilized or resolved:  Yes. Denies suicidal/homicidal ideation: Yes. Issues/concerns per patient self-inventory:  No. Other:  New problem(s) identified: n/a  Discharge Plan or Barriers: Patient will return home and follow-up scheduled with Dr. Toy Care for medication management 01/28/2016 at 10:30am.  Reason for Continuation of Hospitalization: Anxiety Depression Medication stabilization  Comments: n/a  Estimated length of stay: Discharge 01/25/2016  New goal(s): n/a  Review of initial/current patient goals per problem list:   1. Goal(s): Patient will participate in aftercare plan   Met: yes  Target date:3-5 days post admission date   As evidenced by: Patient will participate within aftercare plan AEB aftercare provider and housing plan at discharge being identified.   2. Goal (s): Patient will exhibit decreased depressive symptoms and suicidal ideations.   Met: Yes  Target date: 3-5 days post admission date   As evidenced by: Patient will utilize self-rating of depression at 3 or below and demonstrate decreased signs of depression or be deemed stable for discharge by MD.   Pt denies SI/HI.   3. Goal(s): Patient will demonstrate decreased signs and symptoms of anxiety.   Met: Yes  Target date: 3-5 days post admission date   As evidenced by: Patient will utilize self-rating of anxiety at 3 or below and demonstrated decreased signs of anxiety, or be deemed stable for discharge by MD   Attendees: Patient:  Laura Lloyd 8/12/20173:28 PM  Family:    8/12/20173:28 PM  Physician:  Dr. Weber Cooks, MD 8/12/20173:28 PM  Nursing:   Mertie Moores, RN 8/12/20173:28 PM  Case Manager:   8/12/20173:28 PM  Counselor:   8/12/20173:28 PM  Other:  Lear Ng. Claybon Jabs MSW, [place] 8/12/20173:28 PM  Other:   8/12/20173:28 PM  Other:   8/12/20173:28 PM  Other:  8/12/20173:28 PM  Other:  8/12/20173:28 PM  Other:  8/12/20173:28 PM  Other:  8/12/20173:28 PM  Other:  8/12/20173:28 PM  Other:  8/12/20173:28 PM  Other:   8/12/20173:28 PM   Scribe for Treatment Team:   Lear Ng. [place], [place]  01/25/2016, 3:33 PM

## 2016-01-25 NOTE — Progress Notes (Signed)
D: Observed pt on phone talking with sons. Patient alert and oriented x4. Patient denies SI/HI/AVH. Pt affect is sad. Pt talked at length about issues with her sons. Pt endorsed feeling hopeful in regards to vocational rehabilitation. Pt stater her mood is "better now I know I'm going home." Pt c/o of anxiety and difficulty sleeping.  A: Offered active listening and support. Provided therapeutic communication. Gave xanax prn for anxiety R: Pt pleasant and cooperative. Pt medication compliant. Will continue Q15 min. checks. Safety maintained.

## 2016-01-25 NOTE — [facility] Group Notes (Signed)
[facility] LCSW Group Therapy  01/25/2016 2:25 PM  Type of Therapy:  Group Therapy  Participation Level:  Active  Participation Quality:  Appropriate  Affect:  Appropriate  Cognitive:  Alert  Insight:  Engaged  Engagement in Therapy:  Engaged  Modes of Intervention:  Activity, Dance movement psychotherapisteality Testing, Socialization and Support  Summary of Progress/Problems: Self esteem: Patients discussed self esteem and how it impacts them. They discussed what aspects in their lives has influenced their self esteem. They were challenged to identify changes that are needed in order to improve self esteem. Patients participated in activity where they had to identify positive adjectives they felt described their personality. Patients shared with the group on the following areas: Things I am good at, What I like about my appearance, I've helped others by, What I value the most, compliments I have received, challenges I have overcome, thing that make me unique, and Times I've made others happy. Pt stated she was in a good mood today and that she was ready to go home. Pt was willing to share with the group. CSW and peers provide support to pt for sharing openly with the group.    Laura Lloyd G. Garnette CzechSampson MSW, LCSWA 01/25/2016, 2:28 PM

## 2016-01-25 NOTE — [facility] Group Notes (Signed)
[facility] Group Notes:  (Nursing/MHT/Case Management/Adjunct)  Date:  01/25/2016  Time:  2:39 AM  Type of Therapy:  Psychoeducational Skills  Participation Level:  Active  Participation Quality:  Appropriate  Affect:  Appropriate  Cognitive:  Appropriate  Insight:  Appropriate and Good  Engagement in Group:  Engaged  Modes of Intervention:  Discussion, Socialization and Support  Summary of Progress/Problems:  Laura MilroyLaquanda Lloyd Laura Lloyd 01/25/2016, 2:39 AM

## 2016-01-25 NOTE — Progress Notes (Signed)
  [facility] :  Will you be returning to the same living situation after discharge:  Yes,  Patient is returning home At discharge, do you have transportation home?: Yes,  Patient has arranged transportation home Do you have the ability to pay for your medications: Yes,  Patient has insurance  Release of information consent forms completed and in the chart;  Patient's signature needed at discharge.  Patient to Follow up at: Follow-up Information    [facility]. Go on 01/28/2016.   Why:  Please arrive to your hospital follow-up appointment with your psychiatrist Dr. Evelene CroonKaur for medication managment. Your appointment is August 15th at 10:30AM, please arrive 15 minutes early for prompt services. Bring discharge paperwork and medications. Contact information: [address], [place], [place] [address] Hours: 8:30AM-6:30PM Phone: [phone] Fax: [phone]          Next level of care provider has [facility] to [facility] Link:no  Safety Planning and Suicide Prevention discussed: Yes,  CSW discussed safety planning with Patient. Patient is refusing any contact with family members, friends, or community supports.  Have you used any form of tobacco in the last 30 days? (Cigarettes, Smokeless Tobacco, Cigars, and/or Pipes): Yes  Has patient been referred to the Quitline?: Patient refused referral  Patient has been referred for addiction treatment: Yes  Ahana Najera G. Garnette CzechSampson MSW, LCSWA 01/25/2016, 3:36 PM

## 2016-01-25 NOTE — [facility] Suicide Risk Assessment (Signed)
[facility]   Principal Problem: Severe recurrent major depression without psychotic features [facility](HCC) Discharge Diagnoses:  Patient Active Problem List   Diagnosis Date Noted  . Social anxiety disorder [F40.10] 01/24/2016  . Severe recurrent major depression without psychotic features (HCC) [F33.2] 01/23/2016  . Alcohol use disorder, moderate, dependence (HCC) [F10.20] 01/23/2016  . Suicidal ideation [R45.851] 01/23/2016    Total Time spent with patient: 30 minutes  Musculoskeletal: Strength & Muscle Tone: within normal limits Gait & Station: normal Patient leans: N/A  Psychiatric Specialty Exam: ROS  Blood pressure 119/70, pulse 82, temperature 97.9 F (36.6 C), temperature source Oral, resp. rate 18, height 5\' 1"  (1.549 m), weight 72.6 kg (160 lb), SpO2 98 %.Body mass index is 30.23 kg/m.  General Appearance: Casual  Eye Contact::  Fair  Speech:  Clear and Coherent409  Volume:  Normal  Mood:  Euthymic  Affect:  Appropriate  Thought Process:  Goal Directed  Orientation:  Full (Time, Place, and Person)  Thought Content:  Logical  Suicidal Thoughts:  No  Homicidal Thoughts:  No  Memory:  Immediate;   Good Recent;   Fair Remote;   Fair  Judgement:  Fair  Insight:  Fair  Psychomotor Activity:  Normal  Concentration:  Fair  Recall:  [employer]Fair  Fund of Knowledge:Fair  Language: Fair  Akathisia:  No  Handed:  Right  AIMS (if indicated):     Assets:  Communication Skills Desire for Improvement Financial Resources/Insurance Housing Resilience Social Support  Sleep:  Number of Hours: 7  Cognition: WNL  ADL's:  Intact   Mental Status Per Nursing Assessment::   On Admission:  NA (denies)  Demographic Factors:  Divorced or widowed, Caucasian and Living alone  Loss Factors: Loss of significant relationship  Historical Factors: Impulsivity  Risk Reduction Factors:   Sense of responsibility to family, Religious beliefs about death and  Positive therapeutic relationship  Continued Clinical Symptoms:  Depression:   Comorbid alcohol abuse/dependence  Cognitive Features That Contribute To Risk:  Loss of executive function    Suicide Risk:  Mild:  Suicidal ideation of limited frequency, intensity, duration, and specificity.  There are no identifiable plans, no associated intent, mild dysphoria and related symptoms, good self-control (both objective and subjective assessment), few other risk factors, and identifiable protective factors, including available and accessible social support.  Follow-up Information    [facility]. Go on 01/28/2016.   Why:  Please arrive to your hospital follow-up appointment with your psychiatrist Dr. Evelene CroonKaur for medication managment. Your appointment is August 15th at 10:30AM, please arrive 15 minutes early for prompt services. Bring discharge paperwork and medications. Contact information: [address], [place], [place] [address] Hours: 8:30AM-6:30PM Phone: [phone] Fax: [phone]          Plan Of Care/Follow-up recommendations:  Activity:  Activity as tolerated Diet:  Heart healthy diet Other:  Continue outpatient treatment with primary psychiatrist. Discontinue alcohol abuse.  Mordecai RasmussenJohn Clapacs, MD 01/25/2016, 3:39 PM

## 2016-01-25 NOTE — Progress Notes (Signed)
Pt has been pleasant and cooperative . Pt denies SI and A/V hallucinations. Pt states she is ready to go home.

## 2016-01-25 NOTE — Plan of Care (Signed)
Problem: Coping: Goal: Ability to demonstrate self-control will improve Outcome: Progressing Pt able to demonstrate adequate self-control.

## 2016-01-25 NOTE — Progress Notes (Signed)
Patient denies SI/HI, denies A/V hallucinations. Patient verbalizes understanding of discharge instructions, follow up care and prescriptions. Patient given all belongings from personal locker. Patient escorted out by staff.

## 2016-01-27 NOTE — Progress Notes (Signed)
Recreation Therapy Notes  INPATIENT RECREATION TR PLAN  Patient Details Name: Laura Lloyd MRN: [id number] DOB: November 24, 1970 Today's Date: 01/27/2016  Rec Therapy Plan Is patient appropriate for Therapeutic Recreation?: Yes Treatment times per week: At least once a week TR Treatment/Interventions: 1:1 session, Group participation (Comment) (Appropriate participation in daily recreational therapy tx)  Discharge Criteria Pt will be discharged from therapy if:: Treatment goals are met, Discharged Treatment plan/goals/alternatives discussed and agreed upon by:: Patient/family  Discharge Summary Short term goals set: See Care Plan Short term goals met: Complete Progress toward goals comments: One-to-one attended One-to-one attended: Self-esteem, stress management Reason goals not met: N/A Therapeutic equipment acquired: None Reason patient discharged from therapy: Discharge from hospital Pt/family agrees with progress & goals achieved: Yes Date patient discharged from therapy: 01/25/16   Leonette Monarch, LRT/CTRS 01/27/2016, 12:14 PM

## 2017-01-11 ENCOUNTER — Other Ambulatory Visit: Payer: Self-pay | Admitting: Obstetrics and Gynecology

## 2017-01-11 DIAGNOSIS — Z1231 Encounter for screening mammogram for malignant neoplasm of breast: Secondary | ICD-10-CM

## 2017-04-21 ENCOUNTER — Inpatient Hospital Stay: Payer: BLUE CROSS/BLUE SHIELD

## 2017-04-28 ENCOUNTER — Ambulatory Visit: Payer: Self-pay

## 2017-05-03 NOTE — Progress Notes (Addendum)
Gynecologic Oncology Consult Visit   Referring Provider:  Vilma Prader, MD    Chief Concern: Vaginal dysplasia  Subjective:  Laura Lloyd is a 46 y.o. female who is seen in consultation from Dr. Feliberto Gottron for vaginal intraepithelial neoplasia 2 (VAIN 2).   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  Problem List: Patient Active Problem List   Diagnosis Date Noted  . 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 Social anxiety disorder 01/24/2016  . Severe recurrent major depression without psychotic features (HCC) 01/23/2016  . Alcohol use disorder, moderate, dependence (HCC) 01/23/2016  . Suicidal ideation 01/23/2016    Past Medical History: Past Medical History:  Diagnosis Date  . Allergy     has a past medical history of ADD (attention deficit disorder); DVT (deep venous thrombosis) (CMS-HCC); Fever blister; GERD (gastroesophageal reflux disease); History of anemia; History of anxiety; History of cervical dysplasia; History of depression; History of secondary skin cancer; IBS (irritable bowel syndrome); and Inclusion cyst of vulva    Past Surgical History: Past Surgical History:  Procedure Laterality Date  . ABDOMINAL HYSTERECTOMY    . SKIN CANCER EXCISION     Pt indicated it was removed from her buttocks  . TONSILLECTOMY    . TUBAL LIGATION     has a past surgical history that includes Vulvar excision for VIN II-III (05/01/2013 - Dr. Lynnell Catalan); Total vaginal hysterectomy (10/22/2003 - Dr. Feliberto Gottron); Perineal biopsy (Left, 04/05/2013 - Dr. Feliberto Gottron); Inclusion cyst procedure (09/27/2009 - Dr. Feliberto Gottron); Vaginal biopsy (08/20/2008 - Dr. Feliberto Gottron); Colposcopic evaluation (09/19/2002 - Dr. Feliberto Gottron); Tubal  ligation; Tonsillectomy & Adenoidectomy; Colposcopic evaluation (09/27/2009 - Dr. Feliberto Gottron); Colposcopic evaluation/LEEP (10/23/2002 - Dr. Feliberto Gottron); Colposcopy (01/06/06); Colposcopic evaluation/ vaginal biopsy (08/20/2008); Colposcopic evaluation (10/28/2010 - Dr. Feliberto Gottron); Tonsillectomy (1987); Tubal ligation (1996); excision skin cancer (2014); and Hysterectomy (2005).   Past Gynecologic History:  See HPI  OB History: [birth year]1  OB History  No data available    Family History: Family History  Problem Relation Age of Onset  . Mental illness Mother   . Hypertension Father   . Diabetes Maternal Grandfather   . Breast cancer Neg Hx     family history includes Alcohol abuse in her father; Depression in her mother; Diabetes in her maternal grandfather; High blood pressure (Hypertension) in her father; Rheum arthritis in her other. Social History:  reports that she has been smoking Cigarettes.  She has    Social History: Social History   Socioeconomic History  . Marital status: Divorced    Spouse name: Not on file  . Number of children: Not on file  . Years of education: Not on file  . Highest education level: Not on file  Social Needs  . Financial resource strain: Not on file  . Food insecurity - worry: Not on file  . Food insecurity - inability: Not on file  . Transportation needs - medical: Not on file  . Transportation needs - non-medical: Not on file  Occupational History  . Not on file  Tobacco Use  . Smoking status: Former Smoker    Packs/day: 1.00    Types: Cigarettes    Last attempt to quit: 12/13/2016    Years since quitting: 0.3  . Smokeless tobacco: Never Used  Substance and Sexual Activity  . Alcohol  use: Not on file  . Drug use: No  . Sexual activity: No  Other Topics Concern  . Not on file  Social History Narrative  . Not on file  reports that she has been smoking Cigarettes.  She has been smoking about 1.00 pack per day. She has never used  smokeless tobacco. She reports that she drinks about 7.2 oz of alcohol per week . She reports that she does not use drugs   Allergies: Allergies  Allergen Reactions  . Amoxicillin-Pot Clavulanate Nausea Only  . Moxifloxacin Other (See Comments)    agitation      Current Medications: Current Outpatient Medications  Medication Sig Dispense Refill  . acyclovir (ZOVIRAX) 400 MG tablet Take 400 mg by mouth as needed.     . ALPRAZolam (XANAX) 0.5 MG tablet Take 0.5 mg by mouth as needed.     Marland Kitchen. amphetamine-dextroamphetamine (ADDERALL) 10 MG tablet Take 10 mg by mouth 3 (three) times daily as needed (mostly as needed but prescribed TID).     . DULoxetine (CYMBALTA) 60 MG capsule Take 1 capsule (60 mg total) by mouth daily. 30 capsule 1  . estradiol (ESTRACE) 1 MG tablet Take 1 tablet (1 mg total) by mouth daily. 30 tablet 1  . fluticasone (FLONASE) 50 MCG/ACT nasal spray Place 2 sprays into the nose as needed.     . folic acid (FOLVITE) 1 MG tablet Take 1 tablet (1 mg total) by mouth daily. 30 tablet 1  . pantoprazole (PROTONIX) 40 MG tablet Take 1 tablet (40 mg total) by mouth daily. 30 tablet 1  . [START ON 05/06/2017] fluorouracil (EFUDEX) 5 % cream Fill vaginal applicator 1/3-1/2 full (1.5-2g) of fluorouracil cream. Insert applicator high into vagina and dispense applicator, once a week, at night, for 10 weeks. 40 g 0   No current facility-administered medications for this visit.     Review of Systems General: no complaints  HEENT: no complaints  Lungs: no complaints  Cardiac: no complaints  GI: IBS symptoms  GU: no complaints  Musculoskeletal: no complaints  Extremities: no complaints  Skin: no complaints  Neuro: no complaints  Endocrine: no complaints  Psych: no complaints       Objective:  Physical Examination:  BP 114/77 (BP Location: Left Arm, Patient Position: Sitting)   Pulse 90   Temp 97.8 F (36.6 C) (Tympanic)   Resp 18   Ht 5\' 2"  (1.575 m)   Wt 148 lb 4.8  oz (67.3 kg)   BMI 27.12 kg/m    ECOG Performance Status: 0 - Asymptomatic  General appearance: alert, cooperative and appears stated age HEENT: ATNC Lymph node survey: non-palpable, axillary, inguinal, supraclavicular Cardiovascular: RRR Respiratory: B CTA. Abdomen: SNDNT.  No hernias, no masses, no ascites.  Extremities:No edema Skin exam - Well healed incisions.  Neurological exam reveals alert, oriented, normal speech, no focal findings or movement disorder noted  Pelvic: Exam chaperoned by nurse. Vulva: normal appearing vulva with no masses, tenderness or lesions; Vagina: normal; Adnexa: negative for masses or nodularity; Uterus and Cervix: surgically absent; Rectal: confirms   The risks and benefits of the procedure were reviewed and informed consent obtained. Time out was performed. The patient received pre-procedure teaching and expressed understanding. The post-procedure instructions were reviewed with the patient and she expressed understanding. The patient does not have any barriers to learning.  Colposcopy of the vulva and vagina performed after application of acetic acid. On the vulvar there is a small 5 mm area of  AWE on the left vulva tip of the labia majora at the inferior portion. Biopsy not performed at the most VIN1.      Vaginal colposcopy after acetic acid application revealed mild AWE and healing biopsy sites on the right lateral fornix. There was not mosaicism or punctation.   Post-procedure evaluation the patient was stable without complaints.  Lab Review Labs on site today: n/a  Radiologic Imaging: n/a    Assessment:  Laura Lloyd is a 46 y.o. 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   Medical co-morbidities complicating care: DVT (deep venous thrombosis) (CMS-HCC); GERD; history of secondary skin cancer.   Plan:   Problem List Items Addressed This Visit      Genitourinary   VAIN II (vaginal  intraepithelial neoplasia grade II) - Primary   Relevant Medications   fluorouracil (EFUDEX) 5 % cream (Start on 05/06/2017)      We discussed options for management including surgery with ablation of the vagina cuff apex and right lateral fornix. She would like to avoid surgery and it does appear that Dr. Feliberto GottronSchermerhorn probably removed the most significant lesion because today's colposcopy did not reveal findings worse than VAIN1. Therefore we discussed alternative therapeutic options with topical therapy: either Efudex therapy or Aldara therapy. I typically use Efudex weekly for 10 weeks with interval exam at 2-4 weeks to assess for treatment complications such as ulcers. We discussed other risks including bleeding, pain, infection, watery discharge. If the therapy does not work and she has persistent VAIN2 then we will recommend surgical intervention. I have already reached out to Dr. Feliberto GottronSchermerhorn regarding her exam findings and left a message for him to contact me.   Efudex teaching provided - see patient instructions. We also counseled her about smoking. She is currently vaping and uses a 3% nicotine dispenser. We also discussed the importance of vaping discontinuation and weaning.    Suggested return to clinic in  2-4 weeks.    The patient's diagnosis, an outline of the further diagnostic and laboratory studies which will be required, the recommendation, and alternatives were discussed.  All questions were answered to the patient's satisfaction.    I personally reviewed the patient's history, completed key elements of her exam, and was involved in decision making in conjunction with Ms. Allen.   Artelia LarocheSECORD,Lawan Nanez ALVAREZ, MD   CC:    Vilma Praderhomas Janse Schermerhorn, MD

## 2017-05-04 ENCOUNTER — Ambulatory Visit
Admission: RE | Admit: 2017-05-04 | Discharge: 2017-05-04 | Disposition: A | Discharge status: Home / Self Care | Payer: BLUE CROSS/BLUE SHIELD | Source: Ambulatory Visit | Attending: Obstetrics and Gynecology | Admitting: Obstetrics and Gynecology

## 2017-05-04 DIAGNOSIS — Z1231 Encounter for screening mammogram for malignant neoplasm of breast: Secondary | ICD-10-CM | POA: Diagnosis present

## 2017-05-05 ENCOUNTER — Ambulatory Visit: Payer: Self-pay

## 2017-05-05 ENCOUNTER — Inpatient Hospital Stay: Payer: BLUE CROSS/BLUE SHIELD | Attending: Obstetrics and Gynecology | Admitting: Obstetrics and Gynecology

## 2017-05-05 VITALS — BP 114/77 | HR 90 | Temp 97.8°F | Resp 18 | Ht 62.0 in | Wt 148.3 lb

## 2017-05-05 DIAGNOSIS — F17210 Nicotine dependence, cigarettes, uncomplicated: Secondary | ICD-10-CM | POA: Insufficient documentation

## 2017-05-05 DIAGNOSIS — N891 Moderate vaginal dysplasia: Secondary | ICD-10-CM | POA: Insufficient documentation

## 2017-05-05 DIAGNOSIS — Z79899 Other long term (current) drug therapy: Secondary | ICD-10-CM | POA: Diagnosis not present

## 2017-05-05 DIAGNOSIS — K219 Gastro-esophageal reflux disease without esophagitis: Secondary | ICD-10-CM | POA: Diagnosis not present

## 2017-05-05 DIAGNOSIS — F339 Major depressive disorder, recurrent, unspecified: Secondary | ICD-10-CM | POA: Diagnosis not present

## 2017-05-05 DIAGNOSIS — F1020 Alcohol dependence, uncomplicated: Secondary | ICD-10-CM | POA: Insufficient documentation

## 2017-05-05 DIAGNOSIS — K589 Irritable bowel syndrome without diarrhea: Secondary | ICD-10-CM | POA: Diagnosis not present

## 2017-05-05 DIAGNOSIS — F419 Anxiety disorder, unspecified: Secondary | ICD-10-CM | POA: Insufficient documentation

## 2017-05-05 DIAGNOSIS — Z86718 Personal history of other venous thrombosis and embolism: Secondary | ICD-10-CM | POA: Insufficient documentation

## 2017-05-05 MED ORDER — FLUOROURACIL 5 % EX CREA: TOPICAL_CREAM | CUTANEOUS | 0 refills | Status: AC

## 2017-05-05 NOTE — Progress Notes (Signed)
  Oncology Nurse Navigator Documentation  Navigator Location: [facility] Onc (05/05/17 1000)   )Navigator Encounter Type: Initial GynOnc (05/05/17 1000)  Chaperoned pelvic exam.                   Patient Visit Type: GynOnc;Initial (05/05/17 1000)                              Time Spent with Patient: 30 (05/05/17 1000)

## 2017-05-05 NOTE — Patient Instructions (Addendum)
Patient Information  Vaginal Efudex Cream Efudex (5FU) cream is used in the vagina to treat condyloma (warts) or dysplasia (abnormal tissue) in the vagina. It works by causing a chemical burn, which in turn will cause the top layer of tissue to come off. Since the cream causes burns, it must be used with great care. Please follow these instructions closely. 1. Apply Desitin Ointment or Vaseline (bought at the drug store) around normal surfaces of the vaginal opening 2. At bedtime, insert applicator 1/3-1/2 full of 5-FU (1.5-2 grams) with cream deeply into the vagina. If you often get up in the middle of the night you can also insert a tampon to keep the cream from running out. We recommend using a tampon even if you don't get up in the middle of the night. 3. In the morning, remove the tampon, take a shower and gently wash the vulvar and vagina area well right away. You may also douche.  4. With vaginal treatment anticipate a watery discharge during the time of treatment. 5. If you notice any soreness or rawness of the labia, keep the area coated with Desitin or Vaseline. If the sore or raw labia are allowed to come in direct contact, they may stick together. This may cause pain and/or difficulty urinating (peeing). 6. If you have soreness of the labia, you may soak in a warm tub of water for 20 minutes at a time or try putting ice on it. 7. Use Efudex at bedtime for once every week for 10 weeks. Do not use it during your period. Do not use it during the day. 8. Return to the clinic for an examination 2-4 weeks for an exam to make sure you are not developing an ulcer.  9. Avoid sexual intercourse for at least 3 days after applying 5-fluorouracil.  10. Do not hesitate to call the clinic if you have problems while using the medicine.   Call Ladora DanielKristi Stanton - [phone] once you start the medication to set up your follow-up appointment.

## 2017-05-10 ENCOUNTER — Telehealth: Payer: Self-pay | Admitting: Nurse Practitioner

## 2017-05-10 ENCOUNTER — Telehealth: Payer: Self-pay | Admitting: *Deleted

## 2017-05-10 NOTE — Telephone Encounter (Signed)
Per patient's request call to discuss Efudex. Patient stated that she had been reading on the Internet and was concerned of side effects particularly ulceration of the face. Discussed that cream would be inserted intravaginally once a week and reinforced previous administration instructions and rationale for use. Patient stated she felt reassured and would proceed with administration. She has not yet filled prescription. Offered patient to return to clinic for teaching with nurse when she has picked up medication if she so chooses. Declined at this time. Requested patient to call nurse navigator, Ladora DanielKristi Stanton, when she begins medications of the follow-up could be scheduled for 2 weeks.

## 2017-05-10 NOTE — Telephone Encounter (Signed)
Patient called and reports that she researched the chemotherapy cream and is NOT going to do that and would like to go ahead and have surgery to get it over with so she can get back to work. Asking for a return call from Lauren to discuss this. [phone]

## 2017-05-17 ENCOUNTER — Telehealth: Payer: Self-pay

## 2017-05-17 NOTE — Telephone Encounter (Signed)
Attempted to call Ms Laura Lloyd regarding start date doe Efudex cream. She needs to be seen back with Dr. Sonia SideSecord 2-4 weeks after she starts. Voicemail is full, unable to leave message.  Oncology Nurse Navigator Documentation  Navigator Location: [facility] (05/17/17 1300)   )Navigator Encounter Type: Telephone;Follow-up Appt (05/17/17 1300)                                                    Time Spent with Patient: 15 (05/17/17 1300)

## 2017-05-20 ENCOUNTER — Telehealth: Payer: Self-pay

## 2017-05-20 NOTE — Telephone Encounter (Signed)
Attempted to call Ms Laura Lloyd regarding start date doe Efudex cream. She needs to be seen back with Dr. Sonia SideSecord 2-4 weeks after she starts. Voicemail is full, unable to leave message.  Oncology Nurse Navigator Documentation  Navigator Location: [facility] (05/20/17 1500)   )Navigator Encounter Type: Telephone;Follow-up Appt (05/20/17 1500) Telephone: Outgoing Call (05/20/17 1500)                                                  Time Spent with Patient: 15 (05/20/17 1500)

## 2017-05-20 NOTE — Progress Notes (Signed)
Letter mailed regarding start date on Efudex cream and follow up appointment. Oncology Nurse Navigator Documentation  Navigator Location: [facility] (05/20/17 1526)   )Navigator Encounter Type: Letter/Fax/Email (05/20/17 1526)                                                    Time Spent with Patient: 15 (05/20/17 1526)

## 2017-05-26 ENCOUNTER — Encounter: Payer: Self-pay | Admitting: Nurse Practitioner

## 2017-05-27 ENCOUNTER — Telehealth: Payer: Self-pay

## 2017-05-27 NOTE — Telephone Encounter (Signed)
Ms. Laura Lloyd returned call for follow up appointment. She started her Efudex on 11/28 and completed second dose on 12/5. She did not do dose three because of her personal schedule and she feels like she is developing an ulcer . She describes a 1 cm painful to touch area on her left "inner folds". She describes the area as "kind of like a hole". Reports history of herpes but states this is different. She is agreeable to see Dr. Johnnette LitterBerchuck for follow up on 12/19 at 1330. Instructed that if she develops any new symptoms or the area worsens to call back and she can see NP before the 19th. She verbalized understanding. Oncology Nurse Navigator Documentation  Navigator Location: [facility] (05/27/17 1000)   )Navigator Encounter Type: Telephone (05/27/17 1000) Telephone: Incoming Call (05/27/17 1000)                                                  Time Spent with Patient: 15 (05/27/17 1000)

## 2017-06-02 ENCOUNTER — Inpatient Hospital Stay: Payer: BLUE CROSS/BLUE SHIELD | Attending: Obstetrics and Gynecology | Admitting: Obstetrics and Gynecology

## 2017-06-02 VITALS — BP 124/84 | HR 86 | Temp 97.1°F | Resp 18 | Ht 62.0 in | Wt 147.6 lb

## 2017-06-02 DIAGNOSIS — F1020 Alcohol dependence, uncomplicated: Secondary | ICD-10-CM | POA: Insufficient documentation

## 2017-06-02 DIAGNOSIS — K589 Irritable bowel syndrome without diarrhea: Secondary | ICD-10-CM | POA: Insufficient documentation

## 2017-06-02 DIAGNOSIS — K219 Gastro-esophageal reflux disease without esophagitis: Secondary | ICD-10-CM | POA: Insufficient documentation

## 2017-06-02 DIAGNOSIS — F419 Anxiety disorder, unspecified: Secondary | ICD-10-CM | POA: Diagnosis not present

## 2017-06-02 DIAGNOSIS — Z79899 Other long term (current) drug therapy: Secondary | ICD-10-CM | POA: Diagnosis not present

## 2017-06-02 DIAGNOSIS — F17290 Nicotine dependence, other tobacco product, uncomplicated: Secondary | ICD-10-CM | POA: Insufficient documentation

## 2017-06-02 DIAGNOSIS — Z87891 Personal history of nicotine dependence: Secondary | ICD-10-CM | POA: Diagnosis not present

## 2017-06-02 DIAGNOSIS — R45851 Suicidal ideations: Secondary | ICD-10-CM

## 2017-06-02 DIAGNOSIS — N879 Dysplasia of cervix uteri, unspecified: Secondary | ICD-10-CM | POA: Diagnosis not present

## 2017-06-02 DIAGNOSIS — N891 Moderate vaginal dysplasia: Secondary | ICD-10-CM | POA: Diagnosis present

## 2017-06-02 DIAGNOSIS — Z86718 Personal history of other venous thrombosis and embolism: Secondary | ICD-10-CM | POA: Insufficient documentation

## 2017-06-02 DIAGNOSIS — F338 Other recurrent depressive disorders: Secondary | ICD-10-CM | POA: Insufficient documentation

## 2017-06-02 NOTE — Progress Notes (Signed)
Pt got a swollen spot beside clitoris on left side. It is sore to pinch.  She has only used the efudex cream three times and the spot came up 1 week after her first use of efudex.  She also fell on cardboard box and hurt her left arm and it developed into rash.

## 2017-06-02 NOTE — Progress Notes (Signed)
Gynecologic Oncology Consult Visit   Referring Provider:  Vilma Prader, MD    Chief Concern: Vaginal dysplasia  Subjective:  Laura Lloyd is a 46 y.o. female who today for follow-up for VAIN-2. Was initiated on intra-vaginal Efudex. Reports that she completed 3 weekly treatments and stopped due to discomfort and concern for labial ulcer. She continues to vape daily. Feels well otherwise without specific complaints.   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  05/05/17- Seen by Dr. Sonia Side at [facility] in consultation from Dr. Feliberto Gottron. Discussed surgical options including ablation of vaginal cuff and right lateral fornix. Patient wished to avoid surgery. Vulva and Vaginal Colposcopy revealed small 5mm area of AWE on left vulva tip of the labia majora at the inferior portion. Biopsy not performed at the most VINI. Patient wished to avoid surgery. Initiated intravaginal Efudex weekly for 10 weeks.    Problem List: Patient Active Problem List   Diagnosis Date Noted  . 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 Social anxiety disorder 01/24/2016  . Severe recurrent major depression without psychotic features (HCC) 01/23/2016  . Alcohol use disorder, moderate, dependence (HCC) 01/23/2016  . Suicidal ideation 01/23/2016    Past Medical History: Past Medical History:  Diagnosis Date  . ADD (attention deficit disorder)   . Allergy   . Anemia   . Anxiety   . Cervical dysplasia   . Depression   . DVT (deep venous thrombosis) (HCC)   . Fever blister   . GERD (gastroesophageal reflux disease)   . History of secondary skin cancer   . IBS (irritable bowel syndrome)   .  Inclusion cyst of vulva   . VAIN II (vaginal intraepithelial neoplasia grade II)      Past Surgical History: Past Surgical History:  Procedure Laterality Date  . ABDOMINAL HYSTERECTOMY    . SKIN CANCER EXCISION     Pt indicated it was removed from her buttocks  . TONSILLECTOMY    . TUBAL LIGATION     Past surgical history Tonsillectomy - 1987 Tubal ligation - 1996 09/19/02- Sc[facility]merhorn- colposcopic evaluation 10/23/02- Schermerhorn- Colposcopic evaluation & LEEP 10/22/03- Schermerhorn- Total vaginal hysterectomy 01/06/06- Schermerhorn- colposcopic evaluation 08/20/08- Schermerhorn- vaginal biopsy & colposcopic evaluation 09/27/09- Schermerhorn- inclusion cyst procedure & Colposcopic evaluation 09/27/09- Schermerhorn- Colposcopic evaluation 10/28/10- Schermerhorn- Colposcopic evaluation 2014- excision skin cancer 04/05/13- Schermerhorn- left perineal biopsy 05/01/13- Schermerhorn- Vulvar excision for VIN II-III   Past Gynecologic History:  See HPI  OB History:  OB History  No data available    Family History: Family History  Problem Relation Age of Onset  . Mental illness Mother   . Hypertension Father   . Diabetes Maternal Grandfather   . Breast cancer Neg Hx     Family history includes Alcohol abuse in her father; Depression in her mother; Diabetes in her maternal grandfather; High blood pressure (Hypertension) in her father; Rheum arthritis in her other.  Social History:    Social History   Socioeconomic History  . Marital status: Divorced    Spouse name: Not on file  . Number of children: Not on file  . Years of education: Not on file  . Highest education level: Not on file  Social Needs  .  Financial resource strain: Not on file  . Food insecurity - worry: Not on file  . Food insecurity - inability: Not on file  . Transportation needs - medical: Not on file  . Transportation needs - non-medical: Not on file  Occupational History  . Not on file  Tobacco Use    . Smoking status: Former Smoker    Packs/day: 1.00    Types: Cigarettes    Last attempt to quit: 12/13/2016    Years since quitting: 0.4  . Smokeless tobacco: Never Used  Substance and Sexual Activity  . Alcohol use: Not on file    Comment: 2-3 times a week varies wine, beer or blood mary  . Drug use: No  . Sexual activity: Not Currently  Other Topics Concern  . Not on file  Social History Narrative  . Not on file  Former cigarettes smoker. Now vapes daily. Smoked approximately 1.00 pack per day. She has never used smokeless tobacco. She reports that she drinks about 7.2 oz of alcohol per week . She reports that she does not use drugs   Allergies: Allergies  Allergen Reactions  . Amoxicillin-Pot Clavulanate Nausea Only  . Moxifloxacin Other (See Comments)    agitation      Current Medications: Current Outpatient Medications  Medication Sig Dispense Refill  . acyclovir (ZOVIRAX) 400 MG tablet Take 400 mg by mouth as needed.     . ALPRAZolam (XANAX) 0.5 MG tablet Take 0.5 mg by mouth as needed.     Marland Kitchen. amphetamine-dextroamphetamine (ADDERALL) 10 MG tablet Take 10 mg by mouth 3 (three) times daily as needed (mostly as needed but prescribed TID).     . DULoxetine (CYMBALTA) 60 MG capsule Take 1 capsule (60 mg total) by mouth daily. 30 capsule 1  . estradiol (ESTRACE) 1 MG tablet Take 1 tablet (1 mg total) by mouth daily. 30 tablet 1  . fluorouracil (EFUDEX) 5 % cream Fill vaginal applicator 1/3-1/2 full (1.5-2g) of fluorouracil cream. Insert applicator high into vagina and dispense applicator, once a week, at night, for 10 weeks. 40 g 0  . fluticasone (FLONASE) 50 MCG/ACT nasal spray Place 2 sprays into the nose as needed.     . folic acid (FOLVITE) 1 MG tablet Take 1 tablet (1 mg total) by mouth daily. 30 tablet 1  . pantoprazole (PROTONIX) 40 MG tablet Take 1 tablet (40 mg total) by mouth daily. 30 tablet 1   No current facility-administered medications for this visit.      Review of Systems General: no complaints  HEENT: no complaints  Lungs: no complaints  Cardiac: no complaints  GI: IBS symptoms  GU: no complaints  Musculoskeletal: no complaints  Extremities: no complaints  Skin: no complaints  Neuro: no complaints  Endocrine: no complaints  Psych: feels 'scattered. Could use my ADD medicine today' no other complaints      Objective:  Physical Examination:  BP 124/84   Pulse 86   Temp (!) 97.1 F (36.2 C) (Tympanic)   Resp 18   Ht 5\' 2"  (1.575 m)   Wt 147 lb 9.6 oz (67 kg)   BMI 27.00 kg/m    ECOG Performance Status: 0 - Asymptomatic  General appearance: alert, cooperative and appears stated age HEENT: ATNC Lymph node survey: non-palpable, axillary, inguinal, supraclavicular nodes Cardiovascular: RRR Respiratory: B CTA. Abdomen: SNDNT.  No hernias, no masses, no ascites.  Extremities:No edema Skin exam - No rash or lesions Neurological exam reveals alert, oriented, normal speech, no  focal findings or movement disorder noted  Pelvic: Exam chaperoned by NP. Vulva: tender, new small superficial ulcer on vulva at 2:00. No masses. Vagina: normal; Adnexa: negative for masses or nodularity; Uterus and Cervix: surgically absent; Rectal: deferred   Lab Review 02/12/17-   Radiologic Imaging: IMPRESSION: No mammographic evidence of malignancy. A result letter of this screening mammogram will be mailed directly to the patient.  RECOMMENDATION: Screening mammogram in one year. (Code:SM-B-01Y)  BI-RADS CATEGORY  1: Negative.   Electronically Signed   By: Ted Mcalpineobrinka  Dimitrova M.D.   On: 05/04/2017 15:22    Assessment:  Laura Lloyd is a 46 y.o. female diagnosed with VAIN2 right lateral fornix and possible left VIN1 based on previous colposcopy. Initiated intravaginal Efudex only completing 3 weeks of treatment d/t small superficial vulvar ulcer.   Medical co-morbidities complicating care: DVT (deep venous thrombosis)  (CMS-HCC); GERD; history of secondary skin cancer, nicotine dependency.   Plan:   Problem List Items Addressed This Visit    VAIN II (vaginal intraepithelial neoplasia grade II) - Primary     Discussed options for management including surgery or surveillance as no current findings worse than VAIN1. Exam today, reassuring. Will continue to monitor at this time, as Efudex caused considerable discomfort. Would repeat pap in 4 months. If she has persistent VAIN2 could re-consider surgical intervention at that time. Again encouraged smoking/vaping cessation given her diagnosis.   Suggested return to clinic in 4 months  All questions were answered to the patient's satisfaction.    Consuello MasseLauren Keyle Doby, DNP, AGNP-C [facility] [phone](ascom) [phone] (office) 06/02/17 3:44 PM  I personally interviewed and examined the patient. Agreed with the above/below plan of care. Patient/family questions were answered.  Leida LauthAndrew Berchuck, MD  CC:    Vilma Praderhomas Janse Schermerhorn, MD

## 2017-09-29 ENCOUNTER — Ambulatory Visit: Payer: Self-pay

## 2018-01-14 ENCOUNTER — Emergency Department
Admission: EM | Admit: 2018-01-14 | Discharge: 2018-01-14 | Disposition: A | Discharge status: Home / Self Care | Payer: BLUE CROSS/BLUE SHIELD | Attending: Emergency Medicine | Admitting: Emergency Medicine

## 2018-01-14 ENCOUNTER — Other Ambulatory Visit: Payer: Self-pay

## 2018-01-14 ENCOUNTER — Emergency Department: Payer: BLUE CROSS/BLUE SHIELD

## 2018-01-14 DIAGNOSIS — R222 Localized swelling, mass and lump, trunk: Secondary | ICD-10-CM | POA: Diagnosis present

## 2018-01-14 DIAGNOSIS — N611 Abscess of the breast and nipple: Secondary | ICD-10-CM | POA: Insufficient documentation

## 2018-01-14 DIAGNOSIS — Z79899 Other long term (current) drug therapy: Secondary | ICD-10-CM | POA: Diagnosis not present

## 2018-01-14 DIAGNOSIS — Z87891 Personal history of nicotine dependence: Secondary | ICD-10-CM | POA: Diagnosis not present

## 2018-01-14 DIAGNOSIS — N61 Mastitis without abscess: Secondary | ICD-10-CM

## 2018-01-14 DIAGNOSIS — L0291 Cutaneous abscess, unspecified: Secondary | ICD-10-CM

## 2018-01-14 LAB — CBC WITH DIFFERENTIAL/PLATELET
BASOS PCT: 1 %
Basophils Absolute: 0.1 10*3/uL (ref 0–0.1)
Eosinophils Absolute: 0.3 10*3/uL (ref 0–0.7)
Eosinophils Relative: 3 %
HCT: 33.3 % — ABNORMAL LOW (ref 35.0–47.0)
Hemoglobin: 11.4 g/dL — ABNORMAL LOW (ref 12.0–16.0)
Lymphocytes Relative: 37 %
Lymphs Abs: 4.1 10*3/uL — ABNORMAL HIGH (ref 1.0–3.6)
MCH: 32 pg (ref 26.0–34.0)
MCHC: 34.3 g/dL (ref 32.0–36.0)
MCV: 93.3 fL (ref 80.0–100.0)
MONO ABS: 0.9 10*3/uL (ref 0.2–0.9)
MONOS PCT: 8 %
NEUTROS ABS: 5.7 10*3/uL (ref 1.4–6.5)
NEUTROS PCT: 51 %
Platelets: 312 10*3/uL (ref 150–440)
RBC: 3.57 MIL/uL — ABNORMAL LOW (ref 3.80–5.20)
RDW: 12.9 % (ref 11.5–14.5)
WBC: 11.1 10*3/uL — ABNORMAL HIGH (ref 3.6–11.0)

## 2018-01-14 LAB — COMPREHENSIVE METABOLIC PANEL
ALT: 19 U/L (ref 0–44)
AST: 20 U/L (ref 15–41)
Albumin: 4.4 g/dL (ref 3.5–5.0)
Alkaline Phosphatase: 41 U/L (ref 38–126)
Anion gap: 6 (ref 5–15)
BILIRUBIN TOTAL: 0.3 mg/dL (ref 0.3–1.2)
BUN: 16 mg/dL (ref 6–20)
CALCIUM: 9.3 mg/dL (ref 8.9–10.3)
CO2: 29 mmol/L (ref 22–32)
Chloride: 104 mmol/L (ref 98–111)
Creatinine, Ser: 0.65 mg/dL (ref 0.44–1.00)
GFR calc non Af Amer: >60 mL/min (ref >60)
GLUCOSE: 102 mg/dL — AB (ref 70–99)
Potassium: 3.9 mmol/L (ref 3.5–5.1)
Sodium: 139 mmol/L (ref 135–145)
Total Protein: 7.6 g/dL (ref 6.5–8.1)

## 2018-01-14 MED ORDER — CEFAZOLIN SODIUM-DEXTROSE 1-4 GM/50ML-% IV SOLN
1.0000 g | Freq: Once | INTRAVENOUS | Status: AC
  Administered 2018-01-14: 1 g via INTRAVENOUS
  Filled 2018-01-14: qty 50

## 2018-01-14 MED ORDER — CEPHALEXIN 500 MG PO CAPS: 500.0000 mg | ORAL_CAPSULE | Freq: Four times a day (QID) | ORAL | 0 refills | Status: DC

## 2018-01-14 MED ORDER — VANCOMYCIN HCL 10 G IV SOLR
1500.0000 mg | Freq: Once | INTRAVENOUS | Status: AC
Start: 2018-01-14 — End: 2018-01-14
  Administered 2018-01-14: 1500 mg via INTRAVENOUS
  Filled 2018-01-14: qty 1500

## 2018-01-14 MED ORDER — HYDROCODONE-ACETAMINOPHEN 5-325 MG PO TABS: 1.0000 | ORAL_TABLET | Freq: Four times a day (QID) | ORAL | 0 refills | Status: DC | PRN

## 2018-01-14 MED ORDER — FENTANYL CITRATE (PF) 100 MCG/2ML IJ SOLN
75.0000 ug | Freq: Once | INTRAMUSCULAR | Status: AC
  Administered 2018-01-14: 75 ug via INTRAVENOUS
  Filled 2018-01-14: qty 2

## 2018-01-14 MED ORDER — LIDOCAINE HCL (PF) 1 % IJ SOLN
5.0000 mL | Freq: Once | INTRAMUSCULAR | Status: AC
  Administered 2018-01-14: 5 mL via INTRADERMAL
  Filled 2018-01-14: qty 5

## 2018-01-14 MED ORDER — KETOROLAC TROMETHAMINE 30 MG/ML IJ SOLN
15.0000 mg | Freq: Once | INTRAMUSCULAR | Status: AC
  Administered 2018-01-14: 15 mg via INTRAVENOUS
  Filled 2018-01-14: qty 1

## 2018-01-14 MED ORDER — CEPHALEXIN 500 MG PO CAPS: 500.0000 mg | ORAL_CAPSULE | Freq: Four times a day (QID) | ORAL | 0 refills | Status: AC

## 2018-01-14 MED ORDER — VANCOMYCIN HCL IN DEXTROSE 1-5 GM/200ML-% IV SOLN: 1000.0000 mg | Freq: Two times a day (BID) | INTRAVENOUS | Status: DC

## 2018-01-14 NOTE — Discharge Instructions (Signed)
Please continue taking your Bactrim as prescribed but add on the Keflex that I have prescribed today.  It is critically important that you make an appointment to follow-up with the surgeon within 2 days for a recheck.  Return to the emergency department sooner for any new or worsening symptoms whatsoever.  It was a pleasure to take care of you today, and thank you for coming to our emergency department.  If you have any questions or concerns before leaving please ask the nurse to grab me and I'm more than happy to go through your aftercare instructions again.  If you were prescribed any opioid pain medication today such as Norco, Vicodin, Percocet, morphine, hydrocodone, or oxycodone please make sure you do not drive when you are taking this medication as it can alter your ability to drive safely.  If you have any concerns once you are home that you are not improving or are in fact getting worse before you can make it to your follow-up appointment, please do not hesitate to call [phone] and come back for further evaluation.  Merrily BrittleNeil Jaaziel Peatross, MD  Results for orders placed or performed during the hospital encounter of 01/14/18  Comprehensive metabolic panel  Result Value Ref Range   Sodium 139 135 - 145 mmol/L   Potassium 3.9 3.5 - 5.1 mmol/L   Chloride 104 98 - 111 mmol/L   CO2 29 22 - 32 mmol/L   Glucose, Bld 102 (H) 70 - 99 mg/dL   BUN 16 6 - 20 mg/dL   Creatinine, Ser 3.240.65 0.44 - 1.00 mg/dL   Calcium 9.3 8.9 - 40.110.3 mg/dL   Total Protein 7.6 6.5 - 8.1 g/dL   Albumin 4.4 3.5 - 5.0 g/dL   AST 20 15 - 41 U/L   ALT 19 0 - 44 U/L   Alkaline Phosphatase 41 38 - 126 U/L   Total Bilirubin 0.3 0.3 - 1.2 mg/dL   GFR calc non Af Amer >60 >60 mL/min   GFR calc Af Amer >60 >60 mL/min   Anion gap 6 5 - 15  CBC with Differential  Result Value Ref Range   WBC 11.1 (H) 3.6 - 11.0 K/uL   RBC 3.57 (L) 3.80 - 5.20 MIL/uL   Hemoglobin 11.4 (L) 12.0 - 16.0 g/dL   HCT 02.733.3 (L) 25.335.0 - 66.447.0 %   MCV 93.3 80.0  - 100.0 fL   MCH 32.0 26.0 - 34.0 pg   MCHC 34.3 32.0 - 36.0 g/dL   RDW 40.312.9 47.411.5 - 25.914.5 %   Platelets 312 150 - 440 K/uL   Neutrophils Relative % 51 %   Neutro Abs 5.7 1.4 - 6.5 K/uL   Lymphocytes Relative 37 %   Lymphs Abs 4.1 (H) 1.0 - 3.6 K/uL   Monocytes Relative 8 %   Monocytes Absolute 0.9 0.2 - 0.9 K/uL   Eosinophils Relative 3 %   Eosinophils Absolute 0.3 0 - 0.7 K/uL   Basophils Relative 1 %   Basophils Absolute 0.1 0 - 0.1 K/uL   [place] Breast Ltd Uni Right Inc Axilla  Result Date: 01/14/2018 CLINICAL DATA:  Right breast redness and swelling. Concern for abscess. EXAM: ULTRASOUND OF THE RIGHT BREAST COMPARISON:  None. FINDINGS: Examination performed on an emergent basis, no physical exam performed. Targeted ultrasound is performed, showing ovoid complex heterogeneous hypoechoic structure measuring 2.9 x 1.2 x 2.6 cm at 4 o'clock in the area of redness. There is internal vascularity as well as generalized increased vascularity to the  surrounding soft tissues. IMPRESSION: Ovoid 2.9 x 1.2 x 2.6 cm heterogeneous hypoechoic structure in the right breast at the area of clinical concern at 4 o'clock. There is generalized increased vascularity, with increased blood flow to the adjacent soft tissues and likely within this area. Findings likely represent breast abscess, however hypervascular breast mass is also considered given the internal vascularity. RECOMMENDATION: If a breast abscess, or possible abscess, is demonstrated by ultrasound in the absence of systemic illness, non-intact overlying skin, or other clinical features requiring hospital admission, it is recommended that patient be started on an appropriate course of antibiotics and referred to a Breast Surgeon or a dedicated Breast Center the following morning for possible abscess drainage. BI-RADS CATEGORY  3: Probably benign. Electronically Signed   By: Rubye Oaks M.D.   On: 01/14/2018 05:52

## 2018-01-14 NOTE — ED Triage Notes (Signed)
Patient c/o abscess to right breast beginning 7/19. Patient was started on Bactrim by urgent care for same on Monday. Patient has large area of redness and warmth surrounding abscess. Patient reports area of redness has increased since onset of abscess.

## 2018-01-14 NOTE — ED Notes (Signed)
DC instructions discussed with patient, verbalized understanding.  Denies any further needs at this time. Discussed wound care and return precautions.  Informed pt to call surgeon today to make a follow up appointment.

## 2018-01-14 NOTE — Progress Notes (Signed)
Pharmacy Antibiotic Note  Laura Lloyd is a 47 y.o. female admitted on 01/14/2018 with wound infection.  Pharmacy has been consulted for vanc/cefepime dosing. Patient received vanc 1.5g and cefepime 1g IV x 1  Plan: Will continue vanc 1g IV q12h  Will draw vanc trough 08/03 @ 1500 prior to 4th dose. Will continue cefepime 1g IV q12h  Ke 0.0657 T1/2 12 hrs Goal trough 15 - 20 mcg/mL  Height: 5\' 1"  (154.9 cm) Weight: 145 lb (65.8 kg) IBW/kg (Calculated) : 47.8  Temp (24hrs), Avg:97.5 F (36.4 C), Min:97.5 F (36.4 C), Max:97.5 F (36.4 C)  Recent Labs  Lab 01/14/18 0327  WBC 11.1*  CREATININE 0.65    Estimated Creatinine Clearance: 73.9 mL/min (by C-G formula based on SCr of 0.65 mg/dL).    Allergies  Allergen Reactions  . Amoxicillin-Pot Clavulanate Nausea Only  . Moxifloxacin Other (See Comments)    agitation    Thank you for allowing pharmacy to be a part of this patient's care.  Thomasene Rippleavid Mikisha Roseland, PharmD, BCPS Clinical Pharmacist 01/14/2018

## 2018-01-14 NOTE — ED Provider Notes (Signed)
[facility] Emergency Department Provider Note  ____________________________________________   First MD Initiated Contact with Patient 01/14/18 [phone]     (approximate)  I have reviewed the triage vital signs and the nursing notes.   HISTORY  Chief Complaint Abscess    HPI Laura Lloyd is a 47 y.o. female who self presents to the emergency department with roughly 2 weeks of painful swelling to her right breast.  She said everything began when she was working in a garden and she saw an insect bite her right breast.  She has had progressively painful swelling in her right breast since.  About a week and a half ago she went to urgent care who placed her on Bactrim which does not seem to have resolved the symptoms.  She denies fevers or chills.  Her pain is now severe worse with movement somewhat improved with rest.  She had a normal mammogram most recently 1 year ago.    Past Medical History:  Diagnosis Date  . ADD (attention deficit disorder)   . Allergy   . Anemia   . Anxiety   . Cervical dysplasia   . Depression   . DVT (deep venous thrombosis) (HCC)   . Fever blister   . GERD (gastroesophageal reflux disease)   . History of secondary skin cancer   . IBS (irritable bowel syndrome)   . Inclusion cyst of vulva   . VAIN II (vaginal intraepithelial neoplasia grade II)     Patient Active Problem List   Diagnosis Date Noted  . 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 Social anxiety disorder 01/24/2016  . Severe recurrent major depression without psychotic features (HCC) 01/23/2016  . Alcohol use disorder, moderate, dependence (HCC) 01/23/2016  . Suicidal ideation 01/23/2016    Past Surgical History:  Procedure Laterality Date  . ABDOMINAL HYSTERECTOMY    . SKIN CANCER EXCISION     Pt indicated it was removed from her buttocks  . TONSILLECTOMY    . TUBAL LIGATION      Prior to Admission medications   Medication Sig  Start Date End Date Taking? Authorizing Provider  acyclovir (ZOVIRAX) 400 MG tablet Take 400 mg by mouth as needed.  01/14/17   [provider]  ALPRAZolam Prudy Feeler) 0.5 MG tablet Take 0.5 mg by mouth as needed.  10/30/14   [provider]  amphetamine-dextroamphetamine (ADDERALL) 10 MG tablet Take 10 mg by mouth 3 (three) times daily as needed (mostly as needed but prescribed TID).  02/04/17   [provider]  cephALEXin (KEFLEX) 500 MG capsule Take 1 capsule (500 mg total) by mouth 4 (four) times daily for 10 days. 01/14/18 01/24/18  Merrily Brittle, MD  DULoxetine (CYMBALTA) 60 MG capsule Take 1 capsule (60 mg total) by mouth daily. 01/25/16   Clapacs, Jackquline Denmark, MD  estradiol (ESTRACE) 1 MG tablet Take 1 tablet (1 mg total) by mouth daily. 01/25/16   Clapacs, Jackquline Denmark, MD  fluticasone (FLONASE) 50 MCG/ACT nasal spray Place 2 sprays into the nose as needed.  07/29/16   [provider]  folic acid (FOLVITE) 1 MG tablet Take 1 tablet (1 mg total) by mouth daily. 01/25/16   Clapacs, Jackquline Denmark, MD  HYDROcodone-acetaminophen (NORCO) 5-325 MG tablet Take 1 tablet by mouth every 6 (six) hours as needed for up to 7 doses for severe pain. 01/14/18   Merrily Brittle, MD  pantoprazole (PROTONIX) 40 MG tablet Take 1 tablet (40 mg total) by  mouth daily. 01/25/16   Clapacs, Jackquline DenmarkJohn T, MD    Allergies Amoxicillin-pot clavulanate and Moxifloxacin  Family History  Problem Relation Age of Onset  . Mental illness Mother   . Hypertension Father   . Diabetes Maternal Grandfather   . Breast cancer Neg Hx     Social History Social History   Tobacco Use  . Smoking status: Former Smoker    Packs/day: 1.00    Types: Cigarettes    Last attempt to quit: 12/13/2016    Years since quitting: 1.0  . Smokeless tobacco: Never Used  Substance Use Topics  . Alcohol use: Not on file    Comment: 2-3 times a week varies wine, beer or blood mary  . Drug use: No    Review of Systems Constitutional: No  fever/chills ENT: No sore throat. Cardiovascular: Denies chest pain. Respiratory: Denies shortness of breath. Gastrointestinal: No abdominal pain.  No nausea, no vomiting.  No diarrhea.  No constipation. Musculoskeletal: Negative for back pain. Neurological: Negative for headaches   ____________________________________________   PHYSICAL EXAM:  VITAL SIGNS: ED Triage Vitals  Enc Vitals Group     BP 01/14/18 0316 (!) 145/70     Pulse Rate 01/14/18 0316 94     Resp 01/14/18 0316 18     Temp 01/14/18 0316 (!) 97.5 F (36.4 C)     Temp Source 01/14/18 0316 Oral     SpO2 01/14/18 0316 100 %     Weight 01/14/18 0319 145 lb (65.8 kg)     Height --      Head Circumference --      Peak Flow --      Pain Score 01/14/18 0318 9     Pain Loc --      Pain Edu? --      Excl. in GC? --     Constitutional: Alert and oriented x4 appears obviously uncomfortable although nontoxic no diaphoresis Head: Atraumatic. Nose: No congestion/rhinnorhea. Mouth/Throat: No trismus Neck: No stridor.   Cardiovascular: Soft nontender Respiratory: Normal respiratory effort.  No retractions. Gastrointestinal: Soft nontender Neurologic:  Normal speech and language. No gross focal neurologic deficits are appreciated.  Skin: Breast exam performed with female nurse and Jenise chaperone: Just medial and inferior to the right nipple is a roughly 10 cm area of what appears to be cellulitis with a 3 or 4 cm area of fluctuance overlying.    ____________________________________________  LABS (all labs ordered are listed, but only abnormal results are displayed)  Labs Reviewed  COMPREHENSIVE METABOLIC PANEL - Abnormal; Notable for the following components:      Result Value   Glucose, Bld 102 (*)    All other components within normal limits  CBC WITH DIFFERENTIAL/PLATELET - Abnormal; Notable for the following components:   WBC 11.1 (*)    RBC 3.57 (*)    Hemoglobin 11.4 (*)    HCT 33.3 (*)    Lymphs Abs  4.1 (*)    All other components within normal limits  AEROBIC/ANAEROBIC CULTURE (SURGICAL/DEEP WOUND)    Lab work reviewed by me with slightly elevated white count which is nonspecific __________________________________________  EKG   ____________________________________________  RADIOLOGY  Ultrasound of the right breast reviewed by me could be consistent with a breast abscess ____________________________________________   DIFFERENTIAL includes but not limited to  Breast abscess, breast cancer, mastitis, cellulitis   PROCEDURES  Procedure(s) performed: Yes  .Marland Kitchen.Incision and Drainage Date/Time: 01/14/2018 6:59 AM Performed by: Merrily Brittleifenbark, Rosalin Buster, MD Authorized by: Merrily Brittleifenbark, Kaylene Dawn,  MD   Consent:    Consent obtained:  Verbal   Consent given by:  Patient   Risks discussed:  Incomplete drainage, bleeding, infection, pain and damage to other organs   Alternatives discussed:  Alternative treatment Location:    Type:  Abscess   Size:  3 cm   Location:  Trunk   Trunk location:  R breast Pre-procedure details:    Skin preparation:  Chloraprep Sedation:    Sedation type:  Anxiolysis Anesthesia (see MAR for exact dosages):    Anesthesia method:  Local infiltration   Local anesthetic:  Lidocaine 1% w/o epi Procedure type:    Complexity:  Simple Procedure details:    Needle aspiration: yes     Needle size:  18 G   Drainage:  Purulent   Drainage amount:  Moderate   Packing materials:  None Post-procedure details:    Patient tolerance of procedure:  Tolerated well, no immediate complications Comments:     Performed a needle aspiration initially with a 21-gauge needle however almost no material came out so I converted to an 18-gauge and was able to express roughly 5 cc of purulent material.  Patient tolerated the procedure well.    Critical Care performed: no  ____________________________________________   INITIAL IMPRESSION / ASSESSMENT AND PLAN / ED COURSE  Pertinent  labs & imaging results that were available during my care of the patient were reviewed by me and considered in my medical decision making (see chart for details).   As part of my medical decision making, I reviewed the following data within the electronic MEDICAL RECORD NUMBER History obtained from family if available, nursing notes, old chart and ekg, as well as notes from prior ED visits.       ----------------------------------------- 6:03 AM on 01/14/2018 ----------------------------------------- Spoke with on-call general surgeon Dr. Earlene Plater who recommends needle aspiration of the abscess and follow-up in clinic this week for reevaluation.  They verbally consented the patient and cleansed her breast in usual sterile fashion.  Using 1% lidocaine without epinephrine I anesthetized the abscess and using an 18-gauge needle was able to express 4 to 5 cc of purulent material.  She tolerated well.  I will broaden her antibiotic coverage to cephalexin in addition to Bactrim.  She is medically stable for outpatient management verbalizes understanding and agreement with the plan.  ____________________________________________   FINAL CLINICAL IMPRESSION(S) / ED DIAGNOSES  Final diagnoses:  Abscess  Breast abscess  Mastitis      NEW MEDICATIONS STARTED DURING THIS VISIT:  New Prescriptions   CEPHALEXIN (KEFLEX) 500 MG CAPSULE    Take 1 capsule (500 mg total) by mouth 4 (four) times daily for 10 days.   HYDROCODONE-ACETAMINOPHEN (NORCO) 5-325 MG TABLET    Take 1 tablet by mouth every 6 (six) hours as needed for up to 7 doses for severe pain.     Note:  This document was prepared using Dragon voice recognition software and may include unintentional dictation errors.      Merrily Brittle, MD 01/14/18 [phone]

## 2018-01-15 ENCOUNTER — Encounter: Payer: Self-pay | Admitting: Emergency Medicine

## 2018-01-15 ENCOUNTER — Emergency Department
Admission: EM | Admit: 2018-01-15 | Discharge: 2018-01-15 | Disposition: A | Discharge status: Home / Self Care | Payer: BLUE CROSS/BLUE SHIELD | Attending: Emergency Medicine | Admitting: Emergency Medicine

## 2018-01-15 ENCOUNTER — Other Ambulatory Visit: Payer: Self-pay

## 2018-01-15 DIAGNOSIS — R21 Rash and other nonspecific skin eruption: Secondary | ICD-10-CM | POA: Insufficient documentation

## 2018-01-15 DIAGNOSIS — Z85828 Personal history of other malignant neoplasm of skin: Secondary | ICD-10-CM | POA: Diagnosis not present

## 2018-01-15 DIAGNOSIS — Z79899 Other long term (current) drug therapy: Secondary | ICD-10-CM | POA: Insufficient documentation

## 2018-01-15 DIAGNOSIS — Z87891 Personal history of nicotine dependence: Secondary | ICD-10-CM | POA: Insufficient documentation

## 2018-01-15 DIAGNOSIS — L509 Urticaria, unspecified: Secondary | ICD-10-CM | POA: Diagnosis present

## 2018-01-15 MED ORDER — DIPHENHYDRAMINE HCL 25 MG PO TABS: 25.0000 mg | ORAL_TABLET | Freq: Four times a day (QID) | ORAL | 0 refills | Status: DC | PRN

## 2018-01-15 MED ORDER — FAMOTIDINE 20 MG PO TABS: 20.0000 mg | ORAL_TABLET | Freq: Two times a day (BID) | ORAL | 1 refills | Status: DC

## 2018-01-15 NOTE — ED Provider Notes (Signed)
[facility] Emergency Department Provider Note  ____________________________________________  Time seen: Approximately 3:58 PM  I have reviewed the triage vital signs and the nursing notes.   HISTORY  Chief Complaint Allergic Reaction    HPI Laura Lloyd is a 47 y.o. female presents to the emergency department with mild urticaria of the left upper extremity and left thigh.  Patient was seen 1 day ago in the emergency department and received IV vancomycin and Ancef for a breast abscess.  Patient underwent needle aspiration in the emergency department.  Patient was discharged with Keflex.  Patient is unsure as to whether or not she had Keflex in the past.  She has had no new contact exposures with new substances.  She has not had any shortness of breath, chest tightness, dysphasia, abdominal discomfort, diarrhea or vomiting.  No prior history of anaphylaxis.  Patient reports that rash is mildly pruritic but she is otherwise asymptomatic.  She has tried hydrocortisone cream and patient reports that she is adamantly against the use of a systemic steroid as it induces temporary psychosis for her.  No alleviating measures have been attempted.   Past Medical History:  Diagnosis Date  . ADD (attention deficit disorder)   . Allergy   . Anemia   . Anxiety   . Cervical dysplasia   . Depression   . DVT (deep venous thrombosis) (HCC)   . Fever blister   . GERD (gastroesophageal reflux disease)   . History of secondary skin cancer   . IBS (irritable bowel syndrome)   . Inclusion cyst of vulva   . VAIN II (vaginal intraepithelial neoplasia grade II)     Patient Active Problem List   Diagnosis Date Noted  . 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 Social anxiety disorder 01/24/2016  . Severe recurrent major depression without psychotic features (HCC) 01/23/2016  . Alcohol use disorder, moderate, dependence (HCC) 01/23/2016  . Suicidal  ideation 01/23/2016    Past Surgical History:  Procedure Laterality Date  . ABDOMINAL HYSTERECTOMY    . SKIN CANCER EXCISION     Pt indicated it was removed from her buttocks  . TONSILLECTOMY    . TUBAL LIGATION      Prior to Admission medications   Medication Sig Start Date End Date Taking? Authorizing Provider  acyclovir (ZOVIRAX) 400 MG tablet Take 400 mg by mouth as needed.  01/14/17   [provider]  ALPRAZolam Prudy Feeler) 0.5 MG tablet Take 0.5 mg by mouth as needed.  10/30/14   [provider]  amphetamine-dextroamphetamine (ADDERALL) 10 MG tablet Take 10 mg by mouth 3 (three) times daily as needed (mostly as needed but prescribed TID).  02/04/17   [provider]  cephALEXin (KEFLEX) 500 MG capsule Take 1 capsule (500 mg total) by mouth 4 (four) times daily for 10 days. 01/14/18 01/24/18  Merrily Brittle, MD  diphenhydrAMINE (BENADRYL ALLERGY) 25 MG tablet Take 1 tablet (25 mg total) by mouth every 6 (six) hours as needed for up to 5 days. 01/15/18 01/20/18  Orvil Feil, PA-C  DULoxetine (CYMBALTA) 60 MG capsule Take 1 capsule (60 mg total) by mouth daily. 01/25/16   Clapacs, Jackquline Denmark, MD  estradiol (ESTRACE) 1 MG tablet Take 1 tablet (1 mg total) by mouth daily. 01/25/16   Clapacs, Jackquline Denmark, MD  famotidine (PEPCID) 20 MG tablet Take 1 tablet (20 mg total) by mouth 2 (two) times daily for 5 days. 01/15/18 01/20/18  Pia Mau  M, PA-C  fluticasone (FLONASE) 50 MCG/ACT nasal spray Place 2 sprays into the nose as needed.  07/29/16   [provider]  folic acid (FOLVITE) 1 MG tablet Take 1 tablet (1 mg total) by mouth daily. 01/25/16   Clapacs, Jackquline DenmarkJohn T, MD  HYDROcodone-acetaminophen (NORCO) 5-325 MG tablet Take 1 tablet by mouth every 6 (six) hours as needed for up to 7 doses for severe pain. 01/14/18   Merrily Brittleifenbark, Neil, MD  pantoprazole (PROTONIX) 40 MG tablet Take 1 tablet (40 mg total) by mouth daily. 01/25/16   Clapacs, Jackquline DenmarkJohn T, MD    Allergies Amoxicillin-pot  clavulanate and Moxifloxacin  Family History  Problem Relation Age of Onset  . Mental illness Mother   . Hypertension Father   . Diabetes Maternal Grandfather   . Breast cancer Neg Hx     Social History Social History   Tobacco Use  . Smoking status: Former Smoker    Packs/day: 1.00    Types: Cigarettes    Last attempt to quit: 12/13/2016    Years since quitting: 1.0  . Smokeless tobacco: Never Used  Substance Use Topics  . Alcohol use: Not on file    Comment: 2-3 times a week varies wine, beer or blood mary  . Drug use: No     Review of Systems  Constitutional: No fever/chills Eyes: No visual changes. No discharge ENT: No upper respiratory complaints. Cardiovascular: no chest pain. Respiratory: no cough. No SOB. Gastrointestinal: No abdominal pain.  No nausea, no vomiting.  No diarrhea.  No constipation. Genitourinary: Negative for dysuria. No hematuria Musculoskeletal: Negative for musculoskeletal pain. Skin: Patient has mild urticaria.  Neurological: Negative for headaches, focal weakness or numbness.   ____________________________________________   PHYSICAL EXAM:  VITAL SIGNS: ED Triage Vitals  Enc Vitals Group     BP 01/15/18 1431 129/76     Pulse Rate 01/15/18 1431 94     Resp 01/15/18 1431 16     Temp 01/15/18 1431 97.8 F (36.6 C)     Temp Source 01/15/18 1431 Oral     SpO2 01/15/18 1431 100 %     Weight 01/15/18 1430 145 lb (65.8 kg)     Height --      Head Circumference --      Peak Flow --      Pain Score 01/15/18 1430 0     Pain Loc --      Pain Edu? --      Excl. in GC? --      Constitutional: Alert and oriented. Well appearing and in no acute distress. Eyes: Conjunctivae are normal. PERRL. EOMI. Head: Atraumatic. ENT:      Ears: TMs are pearly.      Nose: No congestion/rhinnorhea.      Mouth/Throat: Mucous membranes are moist.  Neck: No stridor.  No cervical spine tenderness to palpation. Hematological/Lymphatic/Immunilogical: No  cervical lymphadenopathy. Cardiovascular: Normal rate, regular rhythm. Normal S1 and S2.  Good peripheral circulation. Respiratory: Normal respiratory effort without tachypnea or retractions. Lungs CTAB. Good air entry to the bases with no decreased or absent breath sounds. Gastrointestinal: Bowel sounds 4 quadrants. Soft and nontender to palpation. No guarding or rigidity. No palpable masses. No distention. No CVA tenderness. Musculoskeletal: Full range of motion to all extremities. No gross deformities appreciated. Neurologic:  Normal speech and language. No gross focal neurologic deficits are appreciated.  Skin: Patient has mild urticaria of the left upper extremity and thigh. Psychiatric: Mood and affect are normal. Speech  and behavior are normal. Patient exhibits appropriate insight and judgement.   ____________________________________________   LABS (all labs ordered are listed, but only abnormal results are displayed)  Labs Reviewed - No data to display ____________________________________________  EKG   ____________________________________________  RADIOLOGY   No results found.  ____________________________________________    PROCEDURES  Procedure(s) performed:    Procedures    Medications - No data to display   ____________________________________________   INITIAL IMPRESSION / ASSESSMENT AND PLAN / ED COURSE  Pertinent labs & imaging results that were available during my care of the patient were reviewed by me and considered in my medical decision making (see chart for details).  Review of the [place] CSRS was performed in accordance of the [facility] prior to dispensing any controlled drugs.      Assessment and plan Urticaria Patient presents to the emergency department with mild urticaria after receiving vancomycin 1 day ago as well as Ancef and starting Keflex.  Patient is resistant to stopping Keflex and is also resistant to starting prednisone.  Patient  was advised to take Benadryl every 4 hours and to take famotidine twice daily.  Patient was advised to stop Keflex if symptoms worsen and to return to the emergency department for evaluation.  Patient denies shortness of breath, chest tightness, dysphasia, nausea, vomiting, syncope or other symptoms associated with anaphylaxis.  Instructions for patient were reviewed during 2 instances of this emergency department encounter.  Patient voiced understanding.  All patient questions were answered.     ____________________________________________  FINAL CLINICAL IMPRESSION(S) / ED DIAGNOSES  Final diagnoses:  Rash      NEW MEDICATIONS STARTED DURING THIS VISIT:  ED Discharge Orders        Ordered    diphenhydrAMINE (BENADRYL ALLERGY) 25 MG tablet  Every 6 hours PRN     01/15/18 1546    famotidine (PEPCID) 20 MG tablet  2 times daily     01/15/18 1546          This chart was dictated using voice recognition software/Dragon. Despite best efforts to proofread, errors can occur which can change the meaning. Any change was purely unintentional.    Orvil Feil, PA-C 01/15/18 1622    Sharman Cheek, MD 01/18/18 2045

## 2018-01-15 NOTE — ED Notes (Signed)
Patient left without receiving the written discharge instructions. Pia MauJaclyn Woods, PA-C informed, who stated that she went over the discharge instructions extensively with the patient and prescriptions were sent to the pharmacy on record.

## 2018-01-15 NOTE — ED Triage Notes (Signed)
Pt to ED from home , was seen yesterday and received IV antibiotics and RX. PT states she woke up this am with rash noted throughout extreme ties.  NAD noted, denies any SOB or resp distress.

## 2018-01-18 ENCOUNTER — Ambulatory Visit: Payer: Self-pay | Admitting: Surgery

## 2018-01-19 LAB — AEROBIC/ANAEROBIC CULTURE (SURGICAL/DEEP WOUND)

## 2018-01-19 LAB — AEROBIC/ANAEROBIC CULTURE W GRAM STAIN (SURGICAL/DEEP WOUND)

## 2018-01-20 ENCOUNTER — Ambulatory Visit (INDEPENDENT_AMBULATORY_CARE_PROVIDER_SITE_OTHER): Payer: BLUE CROSS/BLUE SHIELD | Admitting: Surgery

## 2018-01-20 ENCOUNTER — Encounter: Payer: Self-pay | Admitting: Surgery

## 2018-01-20 VITALS — BP 134/81 | HR 105 | Temp 97.7°F | Ht 61.0 in | Wt 142.0 lb

## 2018-01-20 DIAGNOSIS — N611 Abscess of the breast and nipple: Secondary | ICD-10-CM | POA: Diagnosis not present

## 2018-01-20 HISTORY — DX: Abscess of the breast and nipple: N61.1

## 2018-01-20 NOTE — Patient Instructions (Signed)
Please finish the Bactrim. Please call if you have questions or concerns.

## 2018-01-20 NOTE — Progress Notes (Signed)
Surgical Clinic History and Physical  Referring provider:  No referring provider defined for this encounter.  HISTORY OF PRESENT ILLNESS (HPI):  47 y.o. female presents for evaluation of Right breast abscess 1 week s/p needle aspiration by ED physician. Patient reports she was working in the garden 2 weeks prior to ED presentation when she felt and saw an insect or spider bite her Right breast, after which the same site on her Right breast became increasingly painful, prompting her to initially present 1.5 weeks later to urgent care facility, who prescribed for patient Bactrim. Unfortunately, her pain persisted unchanged, prompting her to present to ED. Ultrasound at that time demonstrated abscess, for which needle aspiration of abscess was advised and performed and since which patient states she has continued to take Bactrim with 2 days left of her prescription and her pain has resolved. Keflex was added to patient's antibiotics following needle aspiration of her abscess, but she developed itching and rash over her extremities, was advised to discontinue the Keflex, of which she took 2 additional doses with resolution of her itching and rash following discontinuation of the Keflex. She currently denies fever/chills, breast pain, erythema or drainage around or from her former abscess site with mild residual firmness.  PAST MEDICAL HISTORY (PMH):  Past Medical History:  Diagnosis Date  . ADD (attention deficit disorder)   . Alcohol use disorder, moderate, dependence (HCC) 01/23/2016  . Allergy   . Anemia   . Anxiety   . Cervical dysplasia   . Depression   . DVT (deep venous thrombosis) (HCC)   . Fever blister   . GERD (gastroesophageal reflux disease)   . History of secondary skin cancer   . IBS (irritable bowel syndrome)   . Inclusion cyst of vulva   . Severe recurrent major depression without psychotic features (HCC) 01/23/2016  . Social anxiety disorder 01/24/2016  . Suicidal ideation  01/23/2016  . VAIN II (vaginal intraepithelial neoplasia grade II)      PAST SURGICAL HISTORY (PSH):  Past Surgical History:  Procedure Laterality Date  . ABDOMINAL HYSTERECTOMY    . SKIN CANCER EXCISION     Pt indicated it was removed from her buttocks  . TONSILLECTOMY    . TUBAL LIGATION       MEDICATIONS:  Prior to Admission medications   Medication Sig Start Date End Date Taking? Authorizing Provider  acyclovir (ZOVIRAX) 400 MG tablet Take 400 mg by mouth as needed.  01/14/17  Yes [provider]  ALPRAZolam Prudy Feeler) 0.5 MG tablet Take 0.5 mg by mouth as needed.  10/30/14  Yes [provider]  amphetamine-dextroamphetamine (ADDERALL) 10 MG tablet Take 10 mg by mouth 3 (three) times daily as needed (mostly as needed but prescribed TID).  02/04/17  Yes [provider]  cephALEXin (KEFLEX) 500 MG capsule Take 1 capsule (500 mg total) by mouth 4 (four) times daily for 10 days. 01/14/18 01/24/18 Yes Merrily Brittle, MD  diphenhydrAMINE (BENADRYL ALLERGY) 25 MG tablet Take 1 tablet (25 mg total) by mouth every 6 (six) hours as needed for up to 5 days. 01/15/18 01/20/18 Yes Pia Mau M, PA-C  DULoxetine (CYMBALTA) 60 MG capsule Take 1 capsule (60 mg total) by mouth daily. 01/25/16  Yes Clapacs, Jackquline Denmark, MD  estradiol (ESTRACE) 1 MG tablet Take 1 tablet (1 mg total) by mouth daily. 01/25/16  Yes Clapacs, Jackquline Denmark, MD  famotidine (PEPCID) 20 MG tablet Take 1 tablet (20 mg total) by mouth 2 (two) times daily  for 5 days. 01/15/18 01/20/18 Yes Pia Mau M, PA-C  fluticasone (FLONASE) 50 MCG/ACT nasal spray Place 2 sprays into the nose as needed.  07/29/16  Yes [provider]  folic acid (FOLVITE) 1 MG tablet Take 1 tablet (1 mg total) by mouth daily. 01/25/16  Yes Clapacs, Jackquline Denmark, MD  HYDROcodone-acetaminophen (NORCO) 5-325 MG tablet Take 1 tablet by mouth every 6 (six) hours as needed for up to 7 doses for severe pain. 01/14/18  Yes Merrily Brittle, MD  pantoprazole (PROTONIX)  40 MG tablet Take 1 tablet (40 mg total) by mouth daily. 01/25/16  Yes Clapacs, Jackquline Denmark, MD     ALLERGIES:  Allergies  Allergen Reactions  . Amoxicillin-Pot Clavulanate Nausea Only  . Moxifloxacin Other (See Comments)    agitation     SOCIAL HISTORY:  Social History   Socioeconomic History  . Marital status: Divorced    Spouse name: Not on file  . Number of children: Not on file  . Years of education: Not on file  . Highest education level: Not on file  Occupational History  . Not on file  Social Needs  . Financial resource strain: Not on file  . Food insecurity:    Worry: Not on file    Inability: Not on file  . Transportation needs:    Medical: Not on file    Non-medical: Not on file  Tobacco Use  . Smoking status: Former Smoker    Packs/day: 1.00    Types: Cigarettes    Last attempt to quit: 12/13/2016    Years since quitting: 1.1  . Smokeless tobacco: Never Used  Substance and Sexual Activity  . Alcohol use: Not on file    Comment: 2-3 times a week varies wine, beer or blood mary  . Drug use: No  . Sexual activity: Not Currently  Lifestyle  . Physical activity:    Days per week: Not on file    Minutes per session: Not on file  . Stress: Not on file  Relationships  . Social connections:    Talks on phone: Not on file    Gets together: Not on file    Attends religious service: Not on file    Active member of club or organization: Not on file    Attends meetings of clubs or organizations: Not on file    Relationship status: Not on file  . Intimate partner violence:    Fear of current or ex partner: Not on file    Emotionally abused: Not on file    Physically abused: Not on file    Forced sexual activity: Not on file  Other Topics Concern  . Not on file  Social History Narrative  . Not on file    The patient currently resides (home / rehab facility / nursing home): Home The patient normally is (ambulatory / bedbound): Ambulatory  FAMILY HISTORY:   Family History  Problem Relation Age of Onset  . Mental illness Mother   . Hypertension Father   . Diabetes Maternal Grandfather   . Breast cancer Neg Hx     Otherwise negative/non-contributory.  REVIEW OF SYSTEMS:  Constitutional: denies any other weight loss, fever, chills, or sweats  Eyes: denies any other vision changes, history of eye injury  ENT: denies sore throat, hearing problems  Respiratory: denies shortness of breath, wheezing  Cardiovascular: denies chest pain, palpitations  Breasts: as per HPI Gastrointestinal: abdominal pain, N/V, and bowel function as per HPI Musculoskeletal: denies any  other joint pains or cramps  Skin: Denies any other rashes or skin discolorations except as per HPI Neurological: denies any other headache, dizziness, weakness  Psychiatric: Denies any other depression, anxiety   All other review of systems were otherwise negative   VITAL SIGNS:  BP 134/81   Pulse (!) 105   Temp 97.7 F (36.5 C) (Oral)   Ht 5\' 1"  (1.549 m)   Wt 142 lb (64.4 kg)   BMI 26.83 kg/m   PHYSICAL EXAM:  Constitutional:  -- Normal body habitus  -- Awake, alert, and oriented x3  Eyes:  -- Pupils equally round and reactive to light  -- No scleral icterus  Ear, nose, throat:  -- No jugular venous distension -- No nasal drainage, bleeding Pulmonary:  -- No crackles  -- Equal breath sounds bilaterally -- Breathing non-labored at rest Breasts: -- Mild non-tender Right medial breast induration without fluctuance, drainage, or surrounding erythema Cardiovascular:  -- S1, S2 present  -- No pericardial rubs  Breasts: -- Right breast non-tender mild induration at site identified as former abscess without any remaining fluctuance, erythema, or purulent drainage, no palpable mass or nipple discharge Gastrointestinal:  -- Abdomen soft, nontender, non-distended, no guarding/rebound  -- No abdominal masses appreciated, pulsatile or otherwise  Musculoskeletal and  Integumentary:  -- Wounds or skin discoloration: None appreciated except as described above (Breasts) -- Extremities: B/L UE and LE FROM, hands and feet warm, no edema  Neurologic:  -- Motor function: Intact and symmetric -- Sensation: Intact and symmetric  Labs:  CBC:  Lab Results  Component Value Date   WBC 11.1 (H) 01/14/2018   RBC 3.57 (L) 01/14/2018   BMP:  Lab Results  Component Value Date   GLUCOSE 102 (H) 01/14/2018   CO2 29 01/14/2018   BUN 16 01/14/2018   CREATININE 0.65 01/14/2018   CALCIUM 9.3 01/14/2018     Imaging studies:  Focal Right Breast Soft Tissue Ultrasound (01/14/2018) BI-RADS CATEGORY  3: Probably benign. Ovoid 2.9 x 1.2 x 2.6 cm heterogeneous hypoechoic structure in the right breast at the area of clinical concern at 4 o'clock. There is generalized increased vascularity, with increased blood flow to the adjacent soft tissues and likely within this area. Findings likely represent breast abscess, however hypervascular breast mass is also considered given the internal vascularity.  Assessment/Plan:  47 y.o. female with resolving Right breast abscess attributed to insect/spider bite, doing well 1 week s/p needle aspiration, complicated by co-morbidities including GERD, irritable bowel syndrome, history of vaginal intra-epithelial neoplasm, cervical dysplasia, history of DVT, ADD, social anxiety disorder, major depression disorder with suicidal ideation, and history of alcohol abuse/dependence.   - complete prescribed Bactrim x 2 more days  - culture results and antibiotic sensitivities discussed with patient  - wear protective clothing and bug sprays, etc, to minimize future insect/spider bites/stings  - return to clinic as needed, instructed to call if any questions or concerns  All of the above recommendations were discussed with the patient, and all of patient's questions were answered to her expressed satisfaction.  Thank you for the opportunity to  participate in this patient's care.  -- Scherrie GerlachJason E. Earlene Plateravis, MD, RPVI [place]: [facility] General Surgery - Partnering for exceptional care. Office: [phone]

## 2018-01-20 NOTE — Progress Notes (Signed)
Pharmacy Antibiotic Note  Laura Lloyd is a 47 y.o. female seen in the ED on  01/15/2018 with an allergic reaction to either vancomycin or Ancef she received as treatment for a breast abscess on 01/14/18. She was discharged from the ED on 01/15/18 with a prescription for Keflex. A tissue culture from the 8/2 visit has grown out moderate MRSA sensitive to Bactrim. After review of the patient's chart with Dr Pershing ProudSchaevitz he has decided to treat the patient with Bactrim. I have reached out to Laura Lloyd and left a message on her voicemail to return my call so that I may describe her treatment plan and determine where to call in the prescription.  8/9 F/U: I spoke with Laura Lloyd today and she tells me she had an allergic reaction to the cephalexin and went to urgent care clinic where her antibiotic was changed to Bactrim DS, one tab twice daily for 10 days. She is feeling much better and feels that her abscess has completely resolved  Plan: No further action required  Weight: 145 lb (65.8 kg)  Temp (24hrs), Avg:97.7 F (36.5 C), Min:97.7 F (36.5 C), Max:97.7 F (36.5 C)  Recent Labs  Lab 01/14/18 0327  WBC 11.1*  CREATININE 0.65    Estimated Creatinine Clearance: 73.9 mL/min (by C-G formula based on SCr of 0.65 mg/dL).    Allergies  Allergen Reactions  . Amoxicillin-Pot Clavulanate Nausea Only  . Moxifloxacin Other (See Comments)    agitation     Thank you for allowing pharmacy to be a part of this patient's care.  Lowella Bandyodney D Grayer Sproles, PharmD 01/20/2018 4:36 PM

## 2018-04-22 ENCOUNTER — Other Ambulatory Visit: Payer: Self-pay | Admitting: Obstetrics and Gynecology

## 2018-04-22 DIAGNOSIS — Z1231 Encounter for screening mammogram for malignant neoplasm of breast: Secondary | ICD-10-CM

## 2018-06-02 ENCOUNTER — Ambulatory Visit
Admission: RE | Admit: 2018-06-02 | Discharge: 2018-06-02 | Disposition: A | Discharge status: Home / Self Care | Payer: BLUE CROSS/BLUE SHIELD | Source: Ambulatory Visit | Attending: Obstetrics and Gynecology | Admitting: Obstetrics and Gynecology

## 2018-06-02 DIAGNOSIS — Z1231 Encounter for screening mammogram for malignant neoplasm of breast: Secondary | ICD-10-CM | POA: Insufficient documentation

## 2019-07-11 ENCOUNTER — Other Ambulatory Visit: Payer: Self-pay | Admitting: Obstetrics and Gynecology

## 2019-07-11 DIAGNOSIS — Z1231 Encounter for screening mammogram for malignant neoplasm of breast: Secondary | ICD-10-CM

## 2019-08-08 ENCOUNTER — Ambulatory Visit
Admission: RE | Admit: 2019-08-08 | Discharge: 2019-08-08 | Disposition: A | Discharge status: Home / Self Care | Payer: 59 | Source: Ambulatory Visit | Attending: Obstetrics and Gynecology | Admitting: Obstetrics and Gynecology

## 2019-08-08 DIAGNOSIS — Z1231 Encounter for screening mammogram for malignant neoplasm of breast: Secondary | ICD-10-CM | POA: Insufficient documentation

## 2019-10-23 ENCOUNTER — Other Ambulatory Visit
Admission: RE | Admit: 2019-10-23 | Discharge: 2019-10-23 | Disposition: A | Discharge status: Home / Self Care | Payer: 59 | Source: Ambulatory Visit | Attending: Internal Medicine | Admitting: Internal Medicine

## 2019-10-23 DIAGNOSIS — Z01812 Encounter for preprocedural laboratory examination: Secondary | ICD-10-CM | POA: Diagnosis present

## 2019-10-23 DIAGNOSIS — Z20822 Contact with and (suspected) exposure to covid-19: Secondary | ICD-10-CM | POA: Insufficient documentation

## 2019-10-23 LAB — SARS CORONAVIRUS 2 (TAT 6-24 HRS): SARS Coronavirus 2: NEGATIVE

## 2019-10-24 ENCOUNTER — Encounter: Payer: Self-pay | Admitting: Internal Medicine

## 2019-10-25 ENCOUNTER — Encounter
Admission: RE | Disposition: A | Discharge status: Home / Self Care | Payer: Self-pay | Source: Home / Self Care | Attending: Internal Medicine

## 2019-10-25 ENCOUNTER — Ambulatory Visit
Admission: RE | Admit: 2019-10-25 | Discharge: 2019-10-25 | Disposition: A | Discharge status: Home / Self Care | Payer: 59 | Attending: Internal Medicine | Admitting: Internal Medicine

## 2019-10-25 ENCOUNTER — Other Ambulatory Visit: Payer: Self-pay

## 2019-10-25 ENCOUNTER — Ambulatory Visit: Payer: 59 | Admitting: Certified Registered Nurse Anesthetist

## 2019-10-25 ENCOUNTER — Encounter: Payer: Self-pay | Admitting: Internal Medicine

## 2019-10-25 DIAGNOSIS — Z1211 Encounter for screening for malignant neoplasm of colon: Secondary | ICD-10-CM | POA: Diagnosis not present

## 2019-10-25 DIAGNOSIS — Z85828 Personal history of other malignant neoplasm of skin: Secondary | ICD-10-CM | POA: Insufficient documentation

## 2019-10-25 DIAGNOSIS — Z79899 Other long term (current) drug therapy: Secondary | ICD-10-CM | POA: Diagnosis not present

## 2019-10-25 DIAGNOSIS — F1020 Alcohol dependence, uncomplicated: Secondary | ICD-10-CM | POA: Diagnosis not present

## 2019-10-25 DIAGNOSIS — R1314 Dysphagia, pharyngoesophageal phase: Secondary | ICD-10-CM | POA: Insufficient documentation

## 2019-10-25 DIAGNOSIS — F329 Major depressive disorder, single episode, unspecified: Secondary | ICD-10-CM | POA: Insufficient documentation

## 2019-10-25 DIAGNOSIS — F988 Other specified behavioral and emotional disorders with onset usually occurring in childhood and adolescence: Secondary | ICD-10-CM | POA: Insufficient documentation

## 2019-10-25 DIAGNOSIS — F17210 Nicotine dependence, cigarettes, uncomplicated: Secondary | ICD-10-CM | POA: Insufficient documentation

## 2019-10-25 DIAGNOSIS — Z86718 Personal history of other venous thrombosis and embolism: Secondary | ICD-10-CM | POA: Diagnosis not present

## 2019-10-25 DIAGNOSIS — K589 Irritable bowel syndrome without diarrhea: Secondary | ICD-10-CM | POA: Diagnosis not present

## 2019-10-25 DIAGNOSIS — K2100 Gastro-esophageal reflux disease with esophagitis, without bleeding: Secondary | ICD-10-CM | POA: Insufficient documentation

## 2019-10-25 DIAGNOSIS — F419 Anxiety disorder, unspecified: Secondary | ICD-10-CM | POA: Diagnosis not present

## 2019-10-25 DIAGNOSIS — K222 Esophageal obstruction: Secondary | ICD-10-CM | POA: Diagnosis not present

## 2019-10-25 HISTORY — PX: ESOPHAGOGASTRODUODENOSCOPY (EGD) WITH PROPOFOL: SHX5813

## 2019-10-25 HISTORY — PX: COLONOSCOPY WITH PROPOFOL: SHX5780

## 2019-10-25 HISTORY — DX: Methicillin resistant Staphylococcus aureus infection, unspecified site: A49.02

## 2019-10-25 LAB — KOH PREP: Special Requests: NORMAL

## 2019-10-25 SURGERY — COLONOSCOPY WITH PROPOFOL
Anesthesia: General

## 2019-10-25 MED ORDER — PROPOFOL 500 MG/50ML IV EMUL
INTRAVENOUS | Status: DC | PRN
  Administered 2019-10-25: 100 ug/kg/min via INTRAVENOUS
  Administered 2019-10-25: 50 mg via INTRAVENOUS
  Administered 2019-10-25: 25 mg via INTRAVENOUS
  Administered 2019-10-25 (×2): 50 mg via INTRAVENOUS

## 2019-10-25 MED ORDER — PROPOFOL 10 MG/ML IV BOLUS
INTRAVENOUS | Status: AC
  Filled 2019-10-25: qty 40

## 2019-10-25 MED ORDER — PHENYLEPHRINE HCL (PRESSORS) 10 MG/ML IV SOLN
INTRAVENOUS | Status: DC | PRN
  Administered 2019-10-25: 100 ug via INTRAVENOUS

## 2019-10-25 MED ORDER — LIDOCAINE HCL (CARDIAC) PF 100 MG/5ML IV SOSY
PREFILLED_SYRINGE | INTRAVENOUS | Status: DC | PRN
  Administered 2019-10-25: 30 mg via INTRAVENOUS

## 2019-10-25 MED ORDER — SODIUM CHLORIDE 0.9 % IV SOLN: INTRAVENOUS | Status: DC

## 2019-10-25 MED ORDER — GLYCOPYRROLATE 0.2 MG/ML IJ SOLN
INTRAMUSCULAR | Status: DC | PRN
Start: 2019-10-25 — End: 2019-10-25
  Administered 2019-10-25: .1 mg via INTRAVENOUS

## 2019-10-25 NOTE — Interval H&P Note (Signed)
History and Physical Interval Note:  10/25/2019 11:18 AM  Laura Lloyd  has presented today for surgery, with the diagnosis of SCREENING DYSPHAGIA.  The various methods of treatment have been discussed with the patient and family. After consideration of risks, benefits and other options for treatment, the patient has consented to  Procedure(s): COLONOSCOPY WITH PROPOFOL (N/A) ESOPHAGOGASTRODUODENOSCOPY (EGD) WITH PROPOFOL (N/A) as a surgical intervention.  The patient's history has been reviewed, patient examined, no change in status, stable for surgery.  I have reviewed the patient's chart and labs.  Questions were answered to the patient's satisfaction.     [place], [place]

## 2019-10-25 NOTE — Op Note (Addendum)
[facility] Patient Name: Laura Lloyd Procedure Date: 10/25/2019 11:18 AM MRN: [id number] Account #: [id number] Date of Birth: 08-11-1970 Admit Type: Outpatient Age: 49 Room: [facility] ENDO ROOM 3 Gender: Female Note Status: Finalized Procedure:             Colonoscopy Indications:           Screening for colorectal malignant neoplasm Providers:             Benay Pike. Alice Reichert MD, MD Referring MD:          No Local Md, MD (Referring MD) Medicines:             Propofol per Anesthesia Complications:         No immediate complications. Procedure:             Pre-Anesthesia Assessment:                        - The risks and benefits of the procedure and the                         sedation options and risks were discussed with the                         patient. All questions were answered and informed                         consent was obtained.                        - Patient identification and proposed procedure were                         verified prior to the procedure by the nurse. The                         procedure was verified in the procedure room.                        - ASA Grade Assessment: III - A patient with severe                         systemic disease.                        - After reviewing the risks and benefits, the patient                         was deemed in satisfactory condition to undergo the                         procedure.                        After obtaining informed consent, the colonoscope was                         passed under direct vision. Throughout the procedure,                         the patient's blood pressure,  pulse, and oxygen                         saturations were monitored continuously. The                         Colonoscope was introduced through the anus and                         advanced to the the cecum, identified by appendiceal                         orifice and ileocecal valve. The  colonoscopy was                         performed without difficulty. The patient tolerated                         the procedure well. The quality of the bowel                         preparation was good. The ileocecal valve, appendiceal                         orifice, and rectum were photographed. Findings:      The perianal and digital rectal examinations were normal. Pertinent       negatives include normal sphincter tone and no palpable rectal lesions.      The entire examined colon appeared normal on direct and retroflexion       views. Impression:            - The entire examined colon is normal on direct and                         retroflexion views.                        - No specimens collected. Recommendation:        - Await pathology results from EGD, also performed                         today.                        - Monitor results to esophageal dilation                        - Patient has a contact number available for                         emergencies. The signs and symptoms of potential                         delayed complications were discussed with the patient.                         Return to normal activities tomorrow. Written                         discharge instructions were provided to the  patient.                        - Resume previous diet.                        - Continue present medications.                        - Repeat colonoscopy in 10 years for screening                         purposes.                        - Return to GI office in 3 months.                        - Follow up with Jacob Moores, PA-C in [ ]  months.                        - The findings and recommendations were discussed with                         the patient. Procedure Code(s):     --- Professional ---                        , Colorectal cancer screening; colonoscopy on                         individual not meeting criteria for high risk Diagnosis Code(s):      --- Professional ---                        Z12.11, Encounter for screening for malignant neoplasm                         of colon CPT copyright 2019 American Medical Association. All rights reserved. The codes documented in this report are preliminary and upon coder review may  be revised to meet current compliance requirements. 2020 MD, MD 10/25/2019 11:52:12 AM This report has been signed electronically. Number of Addenda: 0 Note Initiated On: 10/25/2019 11:18 AM Scope Withdrawal Time: 0 hours 5 minutes 12 seconds  Total Procedure Duration: 0 hours 7 minutes 53 seconds  Estimated Blood Loss:  Estimated blood loss: none.      [facility]

## 2019-10-25 NOTE — Op Note (Addendum)
[facility] Patient Name: Laura Lloyd Procedure Date: 10/25/2019 11:19 AM MRN: [id number] Account #: [id number] Date of Birth: [birth year] Admit Type: Outpatient Age: 49 Room: [facility] ENDO ROOM 3 Gender: Female Note Status: Finalized Procedure:             Upper GI endoscopy Indications:           Esophageal dysphagia, Gastro-esophageal reflux disease Providers:             Benay Pike. Alice Reichert MD, MD Referring MD:          No Local Md, MD (Referring MD) Medicines:             Propofol per Anesthesia Complications:         No immediate complications. Procedure:             Pre-Anesthesia Assessment:                        - The risks and benefits of the procedure and the                         sedation options and risks were discussed with the                         patient. All questions were answered and informed                         consent was obtained.                        - Patient identification and proposed procedure were                         verified prior to the procedure by the nurse. The                         procedure was verified in the procedure room.                        - ASA Grade Assessment: III - A patient with severe                         systemic disease.                        - After reviewing the risks and benefits, the patient                         was deemed in satisfactory condition to undergo the                         procedure.                        After obtaining informed consent, the endoscope was                         passed under direct vision. Throughout the procedure,                         the patient's  blood pressure, pulse, and oxygen                         saturations were monitored continuously. The Endoscope                         was introduced through the mouth, and advanced to the                         third part of duodenum. The upper GI endoscopy was   accomplished without difficulty. The patient tolerated                         the procedure well. Findings:      Diffuse, white plaques were found in the entire esophagus. Cells for       cytology were obtained by brushing.      Mucosal changes including feline appearance were found in the entire       esophagus. Biopsies were obtained from the proximal and distal esophagus       with cold forceps for histology of suspected eosinophilic esophagitis.      One benign-appearing, intrinsic mild stenosis was found at the       gastroesophageal junction. This stenosis measured 1.2 cm (inner       diameter) x less than one cm (in length). The stenosis was traversed.       The scope was withdrawn. Dilation was performed with a Maloney dilator       with mild resistance at 52 Fr.      The exam was otherwise without abnormality.      The stomach was normal.      The examined duodenum was normal. Impression:            - Esophageal plaques were found, suspicious for                         candidiasis. Cells for cytology obtained.                        - Esophageal mucosal changes suggestive of                         eosinophilic esophagitis. Biopsied.                        - Benign-appearing esophageal stenosis. Dilated.                        - The examination was otherwise normal. Recommendation:        - Await pathology results.                        - Monitor results to esophageal dilation                        - Proceed with colonoscopy Procedure Code(s):     --- Professional ---                        [phone], Esophagogastroduodenoscopy, flexible,  transoral; with biopsy, single or multiple                        43450, Dilation of esophagus, by unguided sound or                         bougie, single or multiple passes Diagnosis Code(s):     --- Professional ---                        K21.9, Gastro-esophageal reflux disease without                          esophagitis                        R13.14, Dysphagia, pharyngoesophageal phase                        K22.2, Esophageal obstruction                        K22.8, Other specified diseases of esophagus                        K22.9, Disease of esophagus, unspecified CPT copyright 2019 American Medical Association. All rights reserved. The codes documented in this report are preliminary and upon coder review may  be revised to meet current compliance requirements. Stanton Kidney MD, MD 10/25/2019 11:41:14 AM This report has been signed electronically. Number of Addenda: 0 Note Initiated On: 10/25/2019 11:19 AM Estimated Blood Loss:  Estimated blood loss: none. Estimated blood loss: none.      [facility]

## 2019-10-25 NOTE — Transfer of Care (Signed)
Immediate Anesthesia Transfer of Care Note  Patient: Laura Lloyd  Procedure(s) Performed: COLONOSCOPY WITH PROPOFOL (N/A ) ESOPHAGOGASTRODUODENOSCOPY (EGD) WITH PROPOFOL (N/A )  Patient Location: PACU  Anesthesia Type:General  Level of Consciousness: awake and alert   Airway & Oxygen Therapy: Patient Spontanous Breathing  Post-op Assessment: Report given to RN  Post vital signs: Reviewed and stable  Last Vitals:  Vitals Value Taken Time  BP 102/75 10/25/19 1155  Temp    Pulse 100 10/25/19 1155  Resp 24 10/25/19 1155  SpO2 94 % 10/25/19 1155    Last Pain:  Vitals:   10/25/19 0953  TempSrc: Temporal  PainSc: 0-No pain         Complications: No apparent anesthesia complications

## 2019-10-25 NOTE — Anesthesia Postprocedure Evaluation (Signed)
Anesthesia Post Note  Patient: Laura Lloyd  Procedure(s) Performed: COLONOSCOPY WITH PROPOFOL (N/A ) ESOPHAGOGASTRODUODENOSCOPY (EGD) WITH PROPOFOL (N/A )  Patient location during evaluation: Endoscopy Anesthesia Type: General Level of consciousness: awake and alert Pain management: pain level controlled Vital Signs Assessment: post-procedure vital signs reviewed and stable Respiratory status: spontaneous breathing, nonlabored ventilation, respiratory function stable and patient connected to nasal cannula oxygen Cardiovascular status: blood pressure returned to baseline and stable Postop Assessment: no apparent nausea or vomiting Anesthetic complications: no     Last Vitals:  Vitals:   10/25/19 1224 10/25/19 1234  BP: 125/75 126/71  Pulse:    Resp:    Temp:    SpO2:      Last Pain:  Vitals:   10/25/19 1234  TempSrc:   PainSc: 0-No pain                 Cleda Mccreedy Piscitello

## 2019-10-25 NOTE — Anesthesia Preprocedure Evaluation (Signed)
Anesthesia Evaluation  Patient identified by MRN, date of birth, ID band Patient awake    Reviewed: Allergy & Precautions, H&P , NPO status , Patient's Chart, lab work & pertinent test results  History of Anesthesia Complications Negative for: history of anesthetic complications  Airway Mallampati: III  TM Distance: >3 FB Neck ROM: full    Dental  (+) Chipped, Poor Dentition   Pulmonary neg shortness of breath, Current Smoker and Patient abstained from smoking.,    Pulmonary exam normal        Cardiovascular Exercise Tolerance: Good (-) angina(-) Past MI negative cardio ROS Normal cardiovascular exam     Neuro/Psych PSYCHIATRIC DISORDERS negative neurological ROS  negative psych ROS   GI/Hepatic Neg liver ROS, GERD  Medicated and Controlled,  Endo/Other  negative endocrine ROS  Renal/GU negative Renal ROS  negative genitourinary   Musculoskeletal   Abdominal   Peds  Hematology negative hematology ROS (+)   Anesthesia Other Findings Past Medical History: 01/20/2018: Abscess of right breast unrelated to pregnancy or  breastfeeding No date: ADD (attention deficit disorder) 01/23/2016: Alcohol use disorder, moderate, dependence (HCC) No date: Allergy No date: Anemia No date: Anxiety No date: Cervical dysplasia No date: Depression No date: DVT (deep venous thrombosis) (HCC) No date: Fever blister No date: GERD (gastroesophageal reflux disease) No date: History of secondary skin cancer No date: IBS (irritable bowel syndrome) No date: Inclusion cyst of vulva No date: MRSA infection     Comment:  spider bite on breast 01/23/2016: Severe recurrent major depression without psychotic  features (HCC) 01/24/2016: Social anxiety disorder 01/23/2016: Suicidal ideation No date: VAIN II (vaginal intraepithelial neoplasia grade II)  Past Surgical History: No date: ABDOMINAL HYSTERECTOMY No date: colonoscopy with  polypectomy No date: SKIN CANCER EXCISION     Comment:  Pt indicated it was removed from her buttocks No date: TONSILLECTOMY No date: TUBAL LIGATION  BMI    Body Mass Index: 29.29 kg/m      Reproductive/Obstetrics negative OB ROS                             Anesthesia Physical Anesthesia Plan  ASA: III  Anesthesia Plan: General   Post-op Pain Management:    Induction: Intravenous  PONV Risk Score and Plan: Propofol infusion and TIVA  Airway Management Planned: Natural Airway and Nasal Cannula  Additional Equipment:   Intra-op Plan:   Post-operative Plan:   Informed Consent: I have reviewed the patients History and Physical, chart, labs and discussed the procedure including the risks, benefits and alternatives for the proposed anesthesia with the patient or authorized representative who has indicated his/her understanding and acceptance.     Dental Advisory Given  Plan Discussed with: Anesthesiologist, CRNA and Surgeon  Anesthesia Plan Comments: (Patient consented for risks of anesthesia including but not limited to:  - adverse reactions to medications - risk of intubation if required - damage to eyes, teeth, lips or other oral mucosa - nerve damage due to positioning  - sore throat or hoarseness - Damage to heart, brain, nerves, lungs or loss of life  Patient voiced understanding.)        Anesthesia Quick Evaluation

## 2019-10-25 NOTE — H&P (Signed)
Outpatient short stay form Pre-procedure 10/25/2019 11:16 AM Laura Lloyd Laura Lloyd, M.D.  Primary Physician: Laverta Baltimore, MD  Reason for visit: Colon cancer screening, GERD, dysphagia  History of present illness: Pleasant 49 year old female presents for chronic GERD reasonably well controlled on omeprazole 20 mg daily.  This is accompanied, however, by nonprogressive dysphagia to solid food, particularly rice at the level of the suprasternal notch.  There is no hematemesis, melena or hematochezia hematochezia.  To Hemoccult cards submitted last year to Dr. Tonette Bihari office were negative for occult blood.  There is no family history of colon cancer or polyps.  No significant change in bowel habits at this time.    Current Facility-Administered Medications:  .  0.9 %  sodium chloride infusion, , Intravenous, Continuous, [place], Benay Pike, MD, Last Rate: 20 mL/hr at 10/25/19 1011, New Bag at 10/25/19 1011  Medications Prior to Admission  Medication Sig Dispense Refill Last Dose  . DULoxetine (CYMBALTA) 60 MG capsule Take 1 capsule (60 mg total) by mouth daily. 30 capsule 1 10/24/2019 at Unknown time  . estradiol (ESTRACE) 1 MG tablet Take 1 tablet (1 mg total) by mouth daily. 30 tablet 1 10/24/2019 at Unknown time  . fluticasone (FLONASE) 50 MCG/ACT nasal spray Place 2 sprays into the nose as needed.    10/24/2019 at Unknown time  . pantoprazole (PROTONIX) 40 MG tablet Take 1 tablet (40 mg total) by mouth daily. 30 tablet 1 10/24/2019 at Unknown time  . acyclovir (ZOVIRAX) 400 MG tablet Take 400 mg by mouth as needed.    Not Taking at Unknown time  . ALPRAZolam (XANAX) 0.5 MG tablet Take 0.5 mg by mouth as needed.    Not Taking at Unknown time  . amphetamine-dextroamphetamine (ADDERALL) 10 MG tablet Take 10 mg by mouth 3 (three) times daily as needed (mostly as needed but prescribed TID).    10/24/2019  . diphenhydrAMINE (BENADRYL ALLERGY) 25 MG tablet Take 1 tablet (25 mg total) by mouth  every 6 (six) hours as needed for up to 5 days. 30 tablet 0   . famotidine (PEPCID) 20 MG tablet Take 1 tablet (20 mg total) by mouth 2 (two) times daily for 5 days. 60 tablet 1   . folic acid (FOLVITE) 1 MG tablet Take 1 tablet (1 mg total) by mouth daily. 30 tablet 1 10/18/2019  . HYDROcodone-acetaminophen (NORCO) 5-325 MG tablet Take 1 tablet by mouth every 6 (six) hours as needed for up to 7 doses for severe pain. (Patient not taking: Reported on 10/25/2019) 7 tablet 0 Not Taking at Unknown time  . tiZANidine (ZANAFLEX) 4 MG capsule Take 4 mg by mouth 3 (three) times daily.   Not Taking at Unknown time     Allergies  Allergen Reactions  . Amoxicillin-Pot Clavulanate Nausea Only  . Ancef [Cefazolin]   . Moxifloxacin Other (See Comments)    agitation     Past Medical History:  Diagnosis Date  . Abscess of right breast unrelated to pregnancy or breastfeeding 01/20/2018  . ADD (attention deficit disorder)   . Alcohol use disorder, moderate, dependence ([place]) 01/23/2016  . Allergy   . Anemia   . Anxiety   . Cervical dysplasia   . Depression   . DVT (deep venous thrombosis) ([place])   . Fever blister   . GERD (gastroesophageal reflux disease)   . History of secondary skin cancer   . IBS (irritable bowel syndrome)   . Inclusion cyst of vulva   . MRSA infection  spider bite on breast  . Severe recurrent major depression without psychotic features (HCC) 01/23/2016  . Social anxiety disorder 01/24/2016  . Suicidal ideation 01/23/2016  . VAIN II (vaginal intraepithelial neoplasia grade II)     Review of systems:  Otherwise negative.    Physical Exam  Gen: Alert, oriented. Appears stated age.  HEENT: [place]/AT. PERRLA. Lungs: CTA, no wheezes. CV: RR nl S1, S2. Abd: soft, benign, no masses. BS+ Ext: No edema. Pulses 2+    Planned procedures: Proceed with EGD and colonoscopy. The patient understands the nature of the planned procedure, indications, risks, alternatives and potential  complications including but not limited to bleeding, infection, perforation, damage to internal organs and possible oversedation/side effects from anesthesia. The patient agrees and gives consent to proceed.  Please refer to procedure notes for findings, recommendations and patient disposition/instructions.     Ko Bardon K. Norma Fredrickson, M.D. Gastroenterology 10/25/2019  11:16 AM

## 2019-10-26 ENCOUNTER — Encounter: Payer: Self-pay | Admitting: *Deleted

## 2019-10-27 LAB — SURGICAL PATHOLOGY

## 2019-11-03 ENCOUNTER — Telehealth: Payer: Self-pay

## 2019-11-03 NOTE — Telephone Encounter (Signed)
Received message to call Laura Lloyd regarding a possible appointment with gyn oncology. 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 Left voicemail with Laura Lloyd that she can return call. No recent referrals have been received.

## 2019-11-08 ENCOUNTER — Telehealth: Payer: Self-pay

## 2019-11-08 NOTE — Telephone Encounter (Signed)
Call returned to Ms. Laura Lloyd. Recent VAIN 1 diagnosis. She reports Dr. Feliberto Gottron recommended colposcopy and she has not has this performed yet. She is unsure if her insurance requires a referral to specialsit. Last seen by gyn oncology 05/2017. Offered an appointment for 12/06/19. She decided to call and schedule an appointment back with Dr. Feliberto Gottron as he had recommended.

## 2020-02-29 ENCOUNTER — Ambulatory Visit
Admission: EM | Admit: 2020-02-29 | Discharge: 2020-02-29 | Disposition: A | Discharge status: Home / Self Care | Payer: 59 | Attending: Family Medicine | Admitting: Family Medicine

## 2020-02-29 ENCOUNTER — Encounter: Payer: Self-pay | Admitting: Emergency Medicine

## 2020-02-29 ENCOUNTER — Other Ambulatory Visit: Payer: Self-pay

## 2020-02-29 DIAGNOSIS — H6983 Other specified disorders of Eustachian tube, bilateral: Secondary | ICD-10-CM | POA: Diagnosis not present

## 2020-02-29 DIAGNOSIS — T161XXA Foreign body in right ear, initial encounter: Secondary | ICD-10-CM

## 2020-02-29 MED ORDER — CIPROFLOXACIN-DEXAMETHASONE 0.3-0.1 % OT SUSP
4.0000 [drp] | Freq: Two times a day (BID) | OTIC | 0 refills | Status: DC
Start: 2020-02-29 — End: 2022-11-24

## 2020-02-29 MED ORDER — CIPROFLOXACIN-DEXAMETHASONE 0.3-0.1 % OT SUSP
4.0000 [drp] | Freq: Two times a day (BID) | OTIC | 0 refills | Status: DC
Start: 2020-02-29 — End: 2020-02-29

## 2020-02-29 NOTE — ED Provider Notes (Signed)
[facility]    CSN: [id number] Arrival date & time: 02/29/20  1351      History   Chief Complaint Chief Complaint  Patient presents with   Otalgia    bilateral    HPI TERESIA MYINT is a 48 y.o. female.   HPI  49 year old female presents with complaint of bilateral ear pain for 2 weeks.  Patient she has had nasal congestion.  Has an extensive history of allergies and uses Flonase on a daily basis.  Has congestion in her ears and has tried Valsalva maneuvers which are only temporary.  Purchased an at home otoscope with monitor and has been in her ears.  On her right ear she has noticed a hair and flaky skin.  Currently her right ear is more bothersome than her left.         Past Medical History:  Diagnosis Date   Abscess of right breast unrelated to pregnancy or breastfeeding 01/20/2018   ADD (attention deficit disorder)    Alcohol use disorder, moderate, dependence (HCC) 01/23/2016   Allergy    Anemia    Anxiety    Cervical dysplasia    Depression    DVT (deep venous thrombosis) (HCC)    Fever blister    GERD (gastroesophageal reflux disease)    History of secondary skin cancer    IBS (irritable bowel syndrome)    Inclusion cyst of vulva    MRSA infection    spider bite on breast   Severe recurrent major depression without psychotic features (HCC) 01/23/2016   Social anxiety disorder 01/24/2016   Suicidal ideation 01/23/2016   VAIN II (vaginal intraepithelial neoplasia grade II)     Patient Active Problem List   Diagnosis Date Noted   VAIN II (vaginal intraepithelial neoplasia grade II) 05/05/2017   Social anxiety disorder 01/24/2016   Severe recurrent major depression without psychotic features (HCC) 01/23/2016   Alcohol use disorder, moderate, dependence (HCC) 01/23/2016   Suicidal ideation 01/23/2016    Past Surgical History:  Procedure Laterality Date   ABDOMINAL HYSTERECTOMY     colonoscopy with polypectomy      COLONOSCOPY WITH PROPOFOL N/A 10/25/2019   Procedure: COLONOSCOPY WITH PROPOFOL;  Surgeon: Toledo, Boykin Nearing, MD;  Location: [facility];  Service: Gastroenterology;  Laterality: N/A;   ESOPHAGOGASTRODUODENOSCOPY (EGD) WITH PROPOFOL N/A 10/25/2019   Procedure: ESOPHAGOGASTRODUODENOSCOPY (EGD) WITH PROPOFOL;  Surgeon: Toledo, Boykin Nearing, MD;  Location: [facility];  Service: Gastroenterology;  Laterality: N/A;   SKIN CANCER EXCISION     Pt indicated it was removed from her buttocks   TONSILLECTOMY     TUBAL LIGATION      OB History   No obstetric history on file.      Home Medications    Prior to Admission medications   Medication Sig Start Date End Date Taking? Authorizing Provider  acyclovir (ZOVIRAX) 400 MG tablet Take 400 mg by mouth as needed.  01/14/17  Yes [provider]  ALPRAZolam Prudy Feeler) 0.5 MG tablet Take 0.5 mg by mouth as needed.  10/30/14  Yes [provider]  amphetamine-dextroamphetamine (ADDERALL) 10 MG tablet Take 10 mg by mouth 3 (three) times daily as needed (mostly as needed but prescribed TID).  02/04/17  Yes [provider]  diphenhydrAMINE (BENADRYL ALLERGY) 25 MG tablet Take 1 tablet (25 mg total) by mouth every 6 (six) hours as needed for up to 5 days. 01/15/18 02/29/20 Yes Pia Mau M, PA-C  DULoxetine (CYMBALTA) 60 MG capsule Take  1 capsule (60 mg total) by mouth daily. 01/25/16  Yes Clapacs, Jackquline Denmark, MD  estradiol (ESTRACE) 1 MG tablet Take 1 tablet (1 mg total) by mouth daily. 01/25/16  Yes Clapacs, Jackquline Denmark, MD  fluticasone (FLONASE) 50 MCG/ACT nasal spray Place 2 sprays into the nose as needed.  07/29/16  Yes [provider]  folic acid (FOLVITE) 1 MG tablet Take 1 tablet (1 mg total) by mouth daily. 01/25/16  Yes Clapacs, Jackquline Denmark, MD  pantoprazole (PROTONIX) 40 MG tablet Take 1 tablet (40 mg total) by mouth daily. 01/25/16  Yes Clapacs, Jackquline Denmark, MD  ciprofloxacin-dexamethasone (CIPRODEX) OTIC suspension Place 4 drops into  both ears 2 (two) times daily. 02/29/20   Lutricia Feil, PA-C  famotidine (PEPCID) 20 MG tablet Take 1 tablet (20 mg total) by mouth 2 (two) times daily for 5 days. 01/15/18 02/29/20  Orvil Feil, PA-C    Family History Family History  Problem Relation Age of Onset   Mental illness Mother    Hypertension Father    Diabetes Maternal Grandfather    Breast cancer Neg Hx     Social History Social History   Tobacco Use   Smoking status: Current Every Day Smoker    Packs/day: 1.00    Types: Cigarettes   Smokeless tobacco: Never Used  Vaping Use   Vaping Use: Every day  Substance Use Topics   Alcohol use: Yes    Alcohol/week: 12.0 standard drinks    Types: 12 Standard drinks or equivalent per week    Comment: 2-3 times a week varies wine, beer or blood mary   Drug use: No     Allergies   Amoxicillin-pot clavulanate, Ancef [cefazolin], and Moxifloxacin   Review of Systems Review of Systems  Constitutional: Positive for activity change. Negative for appetite change, chills, diaphoresis and fatigue.  HENT: Positive for congestion, ear pain and sinus pressure.   All other systems reviewed and are negative.    Physical Exam Triage Vital Signs ED Triage Vitals  Enc Vitals Group     BP 02/29/20 1435 134/90     Pulse Rate 02/29/20 1435 89     Resp 02/29/20 1435 18     Temp 02/29/20 1435 97.9 F (36.6 C)     Temp Source 02/29/20 1435 Oral     SpO2 02/29/20 1435 100 %     Weight 02/29/20 1433 147 lb (66.7 kg)     Height 02/29/20 1433 5' (1.524 m)     Head Circumference --      Peak Flow --      Pain Score 02/29/20 1433 6     Pain Loc --      Pain Edu? --      Excl. in GC? --    No data found.  Updated Vital Signs BP 134/90 (BP Location: Right Arm)    Pulse 89    Temp 97.9 F (36.6 C) (Oral)    Resp 18    Ht 5' (1.524 m)    Wt 147 lb (66.7 kg)    SpO2 100%    BMI 28.71 kg/m   Visual Acuity Right Eye Distance:   Left Eye Distance:   Bilateral  Distance:    Right Eye Near:   Left Eye Near:    Bilateral Near:     Physical Exam Vitals and nursing note reviewed.  Constitutional:      General: She is not in acute distress.    Appearance: Normal appearance.  She is not ill-appearing or toxic-appearing.  HENT:     Head: Normocephalic and atraumatic.     Right Ear: Tympanic membrane normal.     Left Ear: Tympanic membrane, ear canal and external ear normal.     Ears:     Comments: Examination of the right ear reveals a foreign body in the posterior canal apparently to pieces of hair embedded in a veil of cerumen that occupies the posterior half of the canal itself.  After irrigation the foreign bodies have been flushed and the piece of cerumen also flushed providing a nonobstructive view of the TM which was normal.  There is no discomfort with movement of the tragus or the ER uncle. Eyes:     Conjunctiva/sclera: Conjunctivae normal.  Musculoskeletal:        General: Normal range of motion.     Cervical back: Normal range of motion and neck supple.  Skin:    General: Skin is warm and dry.  Neurological:     General: No focal deficit present.     Mental Status: She is alert and oriented to person, place, and time.  Psychiatric:        Mood and Affect: Mood normal.        Behavior: Behavior normal.        Thought Content: Thought content normal.        Judgment: Judgment normal.      UC Treatments / Results  Labs (all labs ordered are listed, but only abnormal results are displayed) Labs Reviewed - No data to display  EKG   Radiology No results found.  Procedures Lavage of the right ear was performed to remove foreign body; was done without incident.  Medications Ordered in UC Medications - No data to display  Initial Impression / Assessment and Plan / UC Course  I have reviewed the triage vital signs and the nursing notes.  Pertinent labs & imaging results that were available during my care of the patient were  reviewed by me and considered in my medical decision making (see chart for details).   49 year old female with a history of allergies presents with bilateral ear pain for [redacted] weeks along with nasal congestion.  He has states that she is used Flonase nasal spray on a daily basis.  She is also recently purchased a otoscope monitor combination and has been viewing the ears at home.  She has noticed a piece of flaky "skin" as well as 2 small pieces of hair in the right ear.  States that her left ear has improved recently and most of her problem is her right ear.  Also been performing Valsalva maneuvers to try and open her ears which would work but only momentarily.  Examination of the right ear confirmed a piece of cerumen partially blocking the TM but at the base of the cerumen were 2 small pieces of hair.  Patient is a Interior and spatial designerhairdresser and noticed that the hair had gotten entrance into her ear at work.  The right ear was lavaged with removal of the small veil of cerumen occluding the view of the TM as well as the hair foreign body.  Examination showed the TM to be normal without erythema bulging present.  Gust that with the patient eustachian tube dysfunction which I believe is partially the problem that she has been having with her ear discomfort and the junction that she is feeling.  Encouraged to continue with the use of the Flonase on a daily  basis.  Did prescribe Ciprodex to use for 5 to 7 days.  We had discussed the use of a Nettie pot as adjunct to the Flonase.  If is not improving I recommend she follow-up with  ear nose and throat.   Final Clinical Impressions(s) / UC Diagnoses   Final diagnoses:  Eustachian tube dysfunction, bilateral  Foreign body in right ear, initial encounter   Discharge Instructions   None    ED Prescriptions    Medication Sig Dispense Auth. Provider   ciprofloxacin-dexamethasone (CIPRODEX) OTIC suspension  (Status: Discontinued) Place 4 drops into both ears 2  (two) times daily. 7.5 mL Lutricia Feil, PA-C   ciprofloxacin-dexamethasone (CIPRODEX) OTIC suspension Place 4 drops into both ears 2 (two) times daily. 7.5 mL Lutricia Feil, PA-C     PDMP not reviewed this encounter.   Lutricia Feil, PA-C 02/29/20 1526

## 2020-02-29 NOTE — ED Triage Notes (Signed)
Patient states that she has had bilateral ear pain x 2 weeks with nasal congestion.

## 2020-04-09 ENCOUNTER — Other Ambulatory Visit: Payer: Self-pay

## 2020-04-09 ENCOUNTER — Encounter: Payer: Self-pay | Admitting: Emergency Medicine

## 2020-04-09 ENCOUNTER — Emergency Department
Admission: EM | Admit: 2020-04-09 | Discharge: 2020-04-09 | Disposition: A | Discharge status: Home / Self Care | Payer: 59 | Attending: Emergency Medicine | Admitting: Emergency Medicine

## 2020-04-09 DIAGNOSIS — L03211 Cellulitis of face: Secondary | ICD-10-CM | POA: Diagnosis present

## 2020-04-09 DIAGNOSIS — F17210 Nicotine dependence, cigarettes, uncomplicated: Secondary | ICD-10-CM | POA: Insufficient documentation

## 2020-04-09 DIAGNOSIS — Y92007 Garden or yard of unspecified non-institutional (private) residence as the place of occurrence of the external cause: Secondary | ICD-10-CM | POA: Insufficient documentation

## 2020-04-09 DIAGNOSIS — W57XXXA Bitten or stung by nonvenomous insect and other nonvenomous arthropods, initial encounter: Secondary | ICD-10-CM | POA: Diagnosis not present

## 2020-04-09 NOTE — ED Notes (Signed)
Patient verbalizes understanding of discharge instructions. Opportunity for questioning and answers were provided. Armband removed by staff, pt discharged from ED. Ambulated out to lobby  

## 2020-04-09 NOTE — ED Triage Notes (Signed)
Patient ambulatory to triage with steady gait, without difficulty or distress noted; pt reports getting bit by mosquito on Sunday; now with increased redness/swelling around left eye & cheek; started bactrim yesterday

## 2020-04-09 NOTE — ED Provider Notes (Signed)
[facility] Emergency Department Provider Note  ____________________________________________  Time seen: Approximately 3:47 AM  I have reviewed the triage vital signs and the nursing notes.   HISTORY  Chief Complaint Cellulitis   HPI Laura Lloyd is a 49 y.o. female with history of several skin infections and MRSA infection who presents for evaluation of cellulitis of the face.  Patient reports that 2 days ago she was working her yard when she got bitten by an insect.  1 day later she started noticing redness and swelling of the face.  She had a televisit with her primary care doctor and was started on Bactrim.  She took 2 doses of it.  She feels that the infection is getting worse and that is why she presented to the emergency room.  She feels that is spreading to the right side of her face.  She denies any double vision or blurry vision, no fever or chills, no nausea or vomiting, no pain behind her eye.   Past Medical History:  Diagnosis Date  . Abscess of right breast unrelated to pregnancy or breastfeeding 01/20/2018  . ADD (attention deficit disorder)   . Alcohol use disorder, moderate, dependence (HCC) 01/23/2016  . Allergy   . Anemia   . Anxiety   . Cervical dysplasia   . Depression   . DVT (deep venous thrombosis) (HCC)   . Fever blister   . GERD (gastroesophageal reflux disease)   . History of secondary skin cancer   . IBS (irritable bowel syndrome)   . Inclusion cyst of vulva   . MRSA infection    spider bite on breast  . Severe recurrent major depression without psychotic features (HCC) 01/23/2016  . Social anxiety disorder 01/24/2016  . Suicidal ideation 01/23/2016  . VAIN II (vaginal intraepithelial neoplasia grade II)     Patient Active Problem List   Diagnosis Date Noted  . 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 Social anxiety disorder 01/24/2016  . Severe recurrent major depression without psychotic  features (HCC) 01/23/2016  . Alcohol use disorder, moderate, dependence (HCC) 01/23/2016  . Suicidal ideation 01/23/2016    Past Surgical History:  Procedure Laterality Date  . ABDOMINAL HYSTERECTOMY    . colonoscopy with polypectomy    . COLONOSCOPY WITH PROPOFOL N/A 10/25/2019   Procedure: COLONOSCOPY WITH PROPOFOL;  Surgeon: Toledo, Boykin Nearing, MD;  Location: [facility] ENDOSCOPY;  Service: Gastroenterology;  Laterality: N/A;  . ESOPHAGOGASTRODUODENOSCOPY (EGD) WITH PROPOFOL N/A 10/25/2019   Procedure: ESOPHAGOGASTRODUODENOSCOPY (EGD) WITH PROPOFOL;  Surgeon: Toledo, Boykin Nearing, MD;  Location: [facility] ENDOSCOPY;  Service: Gastroenterology;  Laterality: N/A;  . SKIN CANCER EXCISION     Pt indicated it was removed from her buttocks  . TONSILLECTOMY    . TUBAL LIGATION      Prior to Admission medications   Medication Sig Start Date End Date Taking? Authorizing Provider  acyclovir (ZOVIRAX) 400 MG tablet Take 400 mg by mouth as needed.  01/14/17   [provider]  ALPRAZolam Prudy Feeler) 0.5 MG tablet Take 0.5 mg by mouth as needed.  10/30/14   [provider]  amphetamine-dextroamphetamine (ADDERALL) 10 MG tablet Take 10 mg by mouth 3 (three) times daily as needed (mostly as needed but prescribed TID).  02/04/17   [provider]  ciprofloxacin-dexamethasone (CIPRODEX) OTIC suspension Place 4 drops into both ears 2 (two) times daily. 02/29/20   Lutricia Feil, PA-C  diphenhydrAMINE (BENADRYL ALLERGY) 25 MG tablet Take 1 tablet (  25 mg total) by mouth every 6 (six) hours as needed for up to 5 days. 01/15/18 02/29/20  Orvil Feil, PA-C  DULoxetine (CYMBALTA) 60 MG capsule Take 1 capsule (60 mg total) by mouth daily. 01/25/16   Clapacs, Jackquline Denmark, MD  estradiol (ESTRACE) 1 MG tablet Take 1 tablet (1 mg total) by mouth daily. 01/25/16   Clapacs, Jackquline Denmark, MD  fluticasone (FLONASE) 50 MCG/ACT nasal spray Place 2 sprays into the nose as needed.  07/29/16   [provider]  folic acid  (FOLVITE) 1 MG tablet Take 1 tablet (1 mg total) by mouth daily. 01/25/16   Clapacs, Jackquline Denmark, MD  pantoprazole (PROTONIX) 40 MG tablet Take 1 tablet (40 mg total) by mouth daily. 01/25/16   Clapacs, Jackquline Denmark, MD  sulfamethoxazole-trimethoprim (BACTRIM DS) 800-160 MG tablet Take 1 tablet by mouth 2 (two) times daily. 04/08/20   [provider]  famotidine (PEPCID) 20 MG tablet Take 1 tablet (20 mg total) by mouth 2 (two) times daily for 5 days. 01/15/18 02/29/20  Orvil Feil, PA-C    Allergies Amoxicillin-pot clavulanate, Ancef [cefazolin], and Moxifloxacin  Family History  Problem Relation Age of Onset  . Mental illness Mother   . Hypertension Father   . Diabetes Maternal Grandfather   . Breast cancer Neg Hx     Social History Social History   Tobacco Use  . Smoking status: Current Every Day Smoker    Packs/day: 1.00    Types: Cigarettes  . Smokeless tobacco: Never Used  Vaping Use  . Vaping Use: Every day  Substance Use Topics  . Alcohol use: Yes    Alcohol/week: 12.0 standard drinks    Types: 12 Standard drinks or equivalent per week    Comment: 2-3 times a week varies wine, beer or blood mary  . Drug use: No    Review of Systems  Constitutional: Negative for fever. Eyes: Negative for visual changes. ENT: Negative for sore throat. + face cellulitis Neck: No neck pain  Cardiovascular: Negative for chest pain. Respiratory: Negative for shortness of breath. Gastrointestinal: Negative for abdominal pain, vomiting or diarrhea. Genitourinary: Negative for dysuria. Musculoskeletal: Negative for back pain. Skin: Negative for rash. Neurological: Negative for headaches, weakness or numbness. Psych: No SI or HI  ____________________________________________   PHYSICAL EXAM:  VITAL SIGNS: ED Triage Vitals  Enc Vitals Group     BP 04/09/20 0316 131/89     Pulse Rate 04/09/20 0316 78     Resp 04/09/20 0316 18     Temp 04/09/20 0316 97.9 F (36.6 C)     Temp  Source 04/09/20 0316 Oral     SpO2 04/09/20 0316 99 %     Weight 04/09/20 0315 147 lb (66.7 kg)     Height 04/09/20 0315 5' (1.524 m)     Head Circumference --      Peak Flow --      Pain Score 04/09/20 0314 8     Pain Loc --      Pain Edu? --      Excl. in GC? --     Constitutional: Alert and oriented. Well appearing and in no apparent distress. HEENT:      Head: Normocephalic and atraumatic.         Face: There is an insect bite located over the left cheek with surrounding erythema and warmth that extends to the upper eyelid.  No palpable fluctuance      Eyes: Conjunctivae are normal.  Sclera is non-icteric.       Mouth/Throat: Mucous membranes are moist.       Neck: Supple with no signs of meningismus. Cardiovascular: Regular rate and rhythm. Respiratory: Normal respiratory effort.  Musculoskeletal: No edema, cyanosis, or erythema of extremities. Neurologic: Normal speech and language. Face is symmetric. Moving all extremities. No gross focal neurologic deficits are appreciated. Skin: Skin is warm, dry and intact. No rash noted. Psychiatric: Mood and affect are normal. Speech and behavior are normal.      ____________________________________________   LABS (all labs ordered are listed, but only abnormal results are displayed)  Labs Reviewed - No data to display ____________________________________________  EKG  none  ____________________________________________  RADIOLOGY  none  ____________________________________________   PROCEDURES  Procedure(s) performed: None Procedures Critical Care performed:  None ____________________________________________   INITIAL IMPRESSION / ASSESSMENT AND PLAN / ED COURSE  49 y.o. female with history of several skin infections and MRSA infection who presents for evaluation of cellulitis of the face after being bitten by an insect 2 days ago.  Patient has taken 2 doses of Bactrim so far.  She does have pictures on her cell  phone of what the infection looked before started on Bactrim and it looks markedly improved to what it is right now.  No concerns of post septal cellulitis with no visual changes, no systemic symptoms, intact extraocular movements and pupillary reflexes.  Recommended continuing Bactrim, cold compresses for the swelling and close follow-up with primary care doctor for reevaluation.  Recommended return to the emergency room for blurry vision, changes in vision, pain behind her eye, fever or chills, headache, or worsening infection.  Old medical records reviewed.      _____________________________________________ Please note:  Patient was evaluated in Emergency Department today for the symptoms described in the history of present illness. Patient was evaluated in the context of the global COVID-19 pandemic, which necessitated consideration that the patient might be at risk for infection with the SARS-CoV-2 virus that causes COVID-19. Institutional protocols and algorithms that pertain to the evaluation of patients at risk for COVID-19 are in a state of rapid change based on information released by regulatory bodies including the CDC and federal and state organizations. These policies and algorithms were followed during the patient's care in the ED.  Some ED evaluations and interventions may be delayed as a result of limited staffing during the pandemic.   [place] Controlled Substance Database was reviewed by me. ____________________________________________   FINAL CLINICAL IMPRESSION(S) / ED DIAGNOSES   Final diagnoses:  Cellulitis of face      NEW MEDICATIONS STARTED DURING THIS VISIT:  ED Discharge Orders    None       Note:  This document was prepared using Dragon voice recognition software and may include unintentional dictation errors.    Nita Sickle, MD 04/09/20 [phone]

## 2020-04-09 NOTE — Discharge Instructions (Signed)
Continue bactrim twice a day for 7 days. Return to the ER for worsening swelling, pain behind your eye, blurry or double vision, or fever. Otherwise follow up with your doctor in 2 days for re-evaluation.

## 2020-12-25 ENCOUNTER — Ambulatory Visit
Admission: EM | Admit: 2020-12-25 | Discharge: 2020-12-25 | Disposition: A | Discharge status: Home / Self Care | Payer: 59 | Attending: Family Medicine | Admitting: Family Medicine

## 2020-12-25 ENCOUNTER — Other Ambulatory Visit: Payer: Self-pay

## 2020-12-25 DIAGNOSIS — U071 COVID-19: Secondary | ICD-10-CM | POA: Diagnosis not present

## 2020-12-25 LAB — RESP PANEL BY RT-PCR (FLU A&B, COVID) ARPGX2
Influenza A by PCR: NEGATIVE
Influenza B by PCR: NEGATIVE
SARS Coronavirus 2 by RT PCR: POSITIVE — AB

## 2020-12-25 MED ORDER — MOLNUPIRAVIR EUA 200MG CAPSULE: 4.0000 | ORAL_CAPSULE | Freq: Two times a day (BID) | ORAL | 0 refills | Status: AC

## 2020-12-25 MED ORDER — KETOROLAC TROMETHAMINE 10 MG PO TABS: 10.0000 mg | ORAL_TABLET | Freq: Four times a day (QID) | ORAL | 0 refills | Status: DC | PRN

## 2020-12-25 NOTE — Discharge Instructions (Addendum)
Take the antiviral as prescribed.  Use the ketorolac as needed for severe pain and headache.   Rest. Fluids.  Quarantine for 5 days from today. You may then resume work afterwards.  Take care  Dr. Adriana Simas

## 2020-12-25 NOTE — ED Provider Notes (Signed)
[facility]    CSN: [id number] Arrival date & time: 12/25/20  4098      History   Chief Complaint Chief Complaint  Patient presents with  . Headache    HPI 50 year old female presents with the above complaint.  Patient reports that she has been experiencing headache and body aches.  Started yesterday and was quite severe today.  She states that her headache is located diffusely.  7/10 in severity.  She has tried Tylenol regularly and an over-the-counter Coricidin which has not helped.  Patient is concerned that she may have a sinus infection.  No documented fever.  She does note chills.  No reported direct sick contacts that she is aware of.  Patient is concerned that she may have influenza.  She states that she feels poorly as when she had influenza few years ago.  No other complaints or concerns at this time.  Past Medical History:  Diagnosis Date  . Abscess of right breast unrelated to pregnancy or breastfeeding 01/20/2018  . ADD (attention deficit disorder)   . Alcohol use disorder, moderate, dependence (HCC) 01/23/2016  . Allergy   . Anemia   . Anxiety   . Cervical dysplasia   . Depression   . DVT (deep venous thrombosis) (HCC)   . Fever blister   . GERD (gastroesophageal reflux disease)   . History of secondary skin cancer   . IBS (irritable bowel syndrome)   . Inclusion cyst of vulva   . MRSA infection    spider bite on breast  . Severe recurrent major depression without psychotic features (HCC) 01/23/2016  . Social anxiety disorder 01/24/2016  . Suicidal ideation 01/23/2016  . VAIN II (vaginal intraepithelial neoplasia grade II)     Patient Active Problem List   Diagnosis Date Noted  . 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 Social anxiety disorder 01/24/2016  . Severe recurrent major depression without psychotic features (HCC) 01/23/2016  . Alcohol use disorder, moderate, dependence (HCC) 01/23/2016  . Suicidal ideation  01/23/2016    Past Surgical History:  Procedure Laterality Date  . ABDOMINAL HYSTERECTOMY    . colonoscopy with polypectomy    . COLONOSCOPY WITH PROPOFOL N/A 10/25/2019   Procedure: COLONOSCOPY WITH PROPOFOL;  Surgeon: Toledo, Boykin Nearing, MD;  Location: [facility] ENDOSCOPY;  Service: Gastroenterology;  Laterality: N/A;  . ESOPHAGOGASTRODUODENOSCOPY (EGD) WITH PROPOFOL N/A 10/25/2019   Procedure: ESOPHAGOGASTRODUODENOSCOPY (EGD) WITH PROPOFOL;  Surgeon: Toledo, Boykin Nearing, MD;  Location: [facility] ENDOSCOPY;  Service: Gastroenterology;  Laterality: N/A;  . SKIN CANCER EXCISION     Pt indicated it was removed from her buttocks  . TONSILLECTOMY    . TUBAL LIGATION      OB History   No obstetric history on file.      Home Medications    Prior to Admission medications   Medication Sig Start Date End Date Taking? Authorizing Provider  ketorolac (TORADOL) 10 MG tablet Take 1 tablet (10 mg total) by mouth every 6 (six) hours as needed for moderate pain or severe pain. 12/25/20  Yes Jnya Brossard G, DO  molnupiravir EUA 200 mg CAPS Take 4 capsules (800 mg total) by mouth 2 (two) times daily for 5 days. 12/25/20 12/30/20 Yes Rodina Pinales G, DO  acyclovir (ZOVIRAX) 400 MG tablet Take 400 mg by mouth as needed.  01/14/17   [provider]  ALPRAZolam Prudy Feeler) 0.5 MG tablet Take 0.5 mg by mouth as needed.  10/30/14   [provider]  amphetamine-dextroamphetamine (ADDERALL) 10 MG tablet Take 10 mg by mouth 3 (three) times daily as needed (mostly as needed but prescribed TID).  02/04/17   [provider]  ciprofloxacin-dexamethasone (CIPRODEX) OTIC suspension Place 4 drops into both ears 2 (two) times daily. 02/29/20   Lutricia Feil, PA-C  diphenhydrAMINE (BENADRYL ALLERGY) 25 MG tablet Take 1 tablet (25 mg total) by mouth every 6 (six) hours as needed for up to 5 days. 01/15/18 02/29/20  Orvil Feil, PA-C  DULoxetine (CYMBALTA) 60 MG capsule Take 1 capsule (60 mg total) by mouth daily.  01/25/16   Clapacs, Jackquline Denmark, MD  estradiol (ESTRACE) 1 MG tablet Take 1 tablet (1 mg total) by mouth daily. 01/25/16   Clapacs, Jackquline Denmark, MD  fluticasone (FLONASE) 50 MCG/ACT nasal spray Place 2 sprays into the nose as needed.  07/29/16   [provider]  folic acid (FOLVITE) 1 MG tablet Take 1 tablet (1 mg total) by mouth daily. 01/25/16   Clapacs, Jackquline Denmark, MD  pantoprazole (PROTONIX) 40 MG tablet Take 1 tablet (40 mg total) by mouth daily. 01/25/16   Clapacs, Jackquline Denmark, MD  famotidine (PEPCID) 20 MG tablet Take 1 tablet (20 mg total) by mouth 2 (two) times daily for 5 days. 01/15/18 02/29/20  Orvil Feil, PA-C    Family History Family History  Problem Relation Age of Onset  . Mental illness Mother   . Hypertension Father   . Diabetes Maternal Grandfather   . Breast cancer Neg Hx     Social History Social History   Tobacco Use  . Smoking status: Every Day    Packs/day: 1.00    Pack years: 0.00    Types: Cigarettes  . Smokeless tobacco: Never  Vaping Use  . Vaping Use: Every day  Substance Use Topics  . Alcohol use: Yes    Alcohol/week: 12.0 standard drinks    Types: 12 Standard drinks or equivalent per week    Comment: 2-3 times a week varies wine, beer or blood mary  . Drug use: No     Allergies   Amoxicillin-pot clavulanate, Ancef [cefazolin], and Moxifloxacin   Review of Systems Review of Systems Per HPI  Physical Exam Triage Vital Signs ED Triage Vitals  Enc Vitals Group     BP 12/25/20 0842 127/80     Pulse Rate 12/25/20 0842 90     Resp 12/25/20 0842 18     Temp 12/25/20 0842 98.7 F (37.1 C)     Temp Source 12/25/20 0842 Oral     SpO2 12/25/20 0842 98 %     Weight 12/25/20 0843 147 lb (66.7 kg)     Height 12/25/20 0843 5' (1.524 m)     Head Circumference --      Peak Flow --      Pain Score 12/25/20 0842 7     Pain Loc --      Pain Edu? --      Excl. in GC? --    No data found.  Updated Vital Signs BP 127/80   Pulse 90   Temp 98.7 F (37.1  C) (Oral)   Resp 18   Ht 5' (1.524 m)   Wt 66.7 kg   SpO2 98%   BMI 28.71 kg/m   Visual Acuity Right Eye Distance:   Left Eye Distance:   Bilateral Distance:    Right Eye Near:   Left Eye Near:    Bilateral Near:     Physical  Exam Vitals and nursing note reviewed.  Constitutional:      General: She is not in acute distress.    Appearance: Normal appearance. She is not ill-appearing.  HENT:     Head: Normocephalic and atraumatic.     Right Ear: Tympanic membrane normal.     Left Ear: Tympanic membrane normal.     Mouth/Throat:     Pharynx: Oropharynx is clear.  Eyes:     General:        Right eye: No discharge.        Left eye: No discharge.     Conjunctiva/sclera: Conjunctivae normal.  Cardiovascular:     Rate and Rhythm: Normal rate and regular rhythm.     Heart sounds: No murmur heard. Pulmonary:     Effort: Pulmonary effort is normal.     Breath sounds: Normal breath sounds. No wheezing, rhonchi or rales.  Neurological:     Mental Status: She is alert.  Psychiatric:        Mood and Affect: Mood normal.        Behavior: Behavior normal.     UC Treatments / Results  Labs (all labs ordered are listed, but only abnormal results are displayed) Labs Reviewed  RESP PANEL BY RT-PCR (FLU A&B, COVID) ARPGX2 - Abnormal; Notable for the following components:      Result Value   SARS Coronavirus 2 by RT PCR POSITIVE (*)    All other components within normal limits    EKG   Radiology No results found.  Procedures Procedures (including critical care time)  Medications Ordered in UC Medications - No data to display  Initial Impression / Assessment and Plan / UC Course  I have reviewed the triage vital signs and the nursing notes.  Pertinent labs & imaging results that were available during my care of the patient were reviewed by me and considered in my medical decision making (see chart for details).    50 year old female presents with COVID-19.  Given  risk factors, treating with molnupiravir.  Toradol as needed for headache and body aches.  Supportive care.  Final Clinical Impressions(s) / UC Diagnoses   Final diagnoses:  COVID     Discharge Instructions      Take the antiviral as prescribed.  Use the ketorolac as needed for severe pain and headache.   Rest. Fluids.  Quarantine for 5 days from today. You may then resume work afterwards.  Take care  Dr. Adriana Simas    ED Prescriptions     Medication Sig Dispense Auth. Provider   molnupiravir EUA 200 mg CAPS Take 4 capsules (800 mg total) by mouth 2 (two) times daily for 5 days. 40 capsule Markeeta Scalf G, DO   ketorolac (TORADOL) 10 MG tablet Take 1 tablet (10 mg total) by mouth every 6 (six) hours as needed for moderate pain or severe pain. 20 tablet Tommie Sams, DO      PDMP not reviewed this encounter.   Tommie Sams, DO 12/25/20 1056

## 2020-12-25 NOTE — ED Triage Notes (Signed)
Pt reports having a "massive" headache x2 days. Also sts she is having bodyahces.

## 2021-02-03 ENCOUNTER — Encounter: Payer: 59 | Admitting: Obstetrics and Gynecology

## 2021-02-20 ENCOUNTER — Encounter: Payer: 59 | Admitting: Obstetrics and Gynecology

## 2021-03-03 ENCOUNTER — Telehealth: Payer: Self-pay

## 2021-03-03 NOTE — Telephone Encounter (Signed)
Pt calling; is switching to [facility] from Dr. Feliberto Gottron; needs estrogen refillee - 0.5mg ; has been out a few days. [phone]  Adv pt [facility] cannot ethically rx this med without seeing her first.  To contact pharmacy.

## 2021-03-18 ENCOUNTER — Other Ambulatory Visit (HOSPITAL_COMMUNITY)
Admission: RE | Admit: 2021-03-18 | Discharge: 2021-03-18 | Disposition: A | Discharge status: Home / Self Care | Payer: 59 | Source: Ambulatory Visit | Attending: Obstetrics and Gynecology | Admitting: Obstetrics and Gynecology

## 2021-03-18 ENCOUNTER — Other Ambulatory Visit: Payer: Self-pay

## 2021-03-18 ENCOUNTER — Ambulatory Visit (INDEPENDENT_AMBULATORY_CARE_PROVIDER_SITE_OTHER): Payer: 59 | Admitting: Obstetrics and Gynecology

## 2021-03-18 ENCOUNTER — Encounter: Payer: Self-pay | Admitting: Obstetrics and Gynecology

## 2021-03-18 VITALS — BP 115/72 | Ht 60.0 in | Wt 148.8 lb

## 2021-03-18 DIAGNOSIS — N893 Dysplasia of vagina, unspecified: Secondary | ICD-10-CM | POA: Diagnosis present

## 2021-03-18 DIAGNOSIS — N903 Dysplasia of vulva, unspecified: Secondary | ICD-10-CM

## 2021-03-18 DIAGNOSIS — Z0000 Encounter for general adult medical examination without abnormal findings: Secondary | ICD-10-CM

## 2021-03-18 DIAGNOSIS — Z1329 Encounter for screening for other suspected endocrine disorder: Secondary | ICD-10-CM | POA: Diagnosis not present

## 2021-03-18 DIAGNOSIS — Z13220 Encounter for screening for lipoid disorders: Secondary | ICD-10-CM

## 2021-03-18 DIAGNOSIS — Z130 Encounter for screening for diseases of the blood and blood-forming organs and certain disorders involving the immune mechanism: Secondary | ICD-10-CM

## 2021-03-18 DIAGNOSIS — Z131 Encounter for screening for diabetes mellitus: Secondary | ICD-10-CM

## 2021-03-18 DIAGNOSIS — Z01419 Encounter for gynecological examination (general) (routine) without abnormal findings: Secondary | ICD-10-CM

## 2021-03-18 DIAGNOSIS — Z1231 Encounter for screening mammogram for malignant neoplasm of breast: Secondary | ICD-10-CM

## 2021-03-18 NOTE — Patient Instructions (Signed)
[employer] Recommended Dietary Allowances for Calcium and Vitamin D  Age (yr) Calcium Recommended Dietary Allowance (mg/day) Vitamin D Recommended Dietary Allowance (international units/day)  9-18 1,300 600  19-50 1,000 600  51-70 1,200 600  71 and older 1,200 800  Data from [employer]. Dietary reference intakes: calcium, vitamin D. [place], [place]: National [employer]ies [employer]; 2011.   Exercising to Stay Healthy To become healthy and stay healthy, it is recommended that you do moderate-intensity and vigorous-intensity exercise. You can tell that you are exercising at a moderate intensity if your heart starts beating faster and you start breathing faster but can still hold a conversation. You can tell that you are exercising at a vigorous intensity if you are breathing much harder and faster and cannot hold a conversation while exercising. How can exercise benefit me? Exercising regularly is important. It has many health benefits, such as: Improving overall fitness, flexibility, and endurance. Increasing bone density. Helping with weight control. Decreasing body fat. Increasing muscle strength and endurance. Reducing stress and tension, anxiety, depression, or anger. Improving overall health. What guidelines should I follow while exercising? Before you start a new exercise program, talk with your health care provider. Do not exercise so much that you hurt yourself, feel dizzy, or get very short of breath. Wear comfortable clothes and wear shoes with good support. Drink plenty of water while you exercise to prevent dehydration or heat stroke. Work out until your breathing and your heartbeat get faster (moderate intensity). How often should I exercise? Choose an activity that you enjoy, and set realistic goals. Your health care provider can help you make an activity plan that is individually designed and works best for you. Exercise regularly as told by your health  care provider. This may include: Doing strength training two times a week, such as: Lifting weights. Using resistance bands. Push-ups. Sit-ups. Yoga. Doing a certain intensity of exercise for a given amount of time. Choose from these options: A total of 150 minutes of moderate-intensity exercise every week. A total of 75 minutes of vigorous-intensity exercise every week. A mix of moderate-intensity and vigorous-intensity exercise every week. Children, pregnant women, people who have not exercised regularly, people who are overweight, and older adults may need to talk with a health care provider about what activities are safe to perform. If you have a medical condition, be sure to talk with your health care provider before you start a new exercise program. What are some exercise ideas? Moderate-intensity exercise ideas include: Walking 1 mile (1.6 km) in about 15 minutes. Biking. Hiking. Golfing. Dancing. Water aerobics. Vigorous-intensity exercise ideas include: Walking 4.5 miles (7.2 km) or more in about 1 hour. Jogging or running 5 miles (8 km) in about 1 hour. Biking 10 miles (16.1 km) or more in about 1 hour. Lap swimming. Roller-skating or in-line skating. Cross-country skiing. Vigorous competitive sports, such as football, basketball, and soccer. Jumping rope. Aerobic dancing. What are some everyday activities that can help me get exercise? Yard work, such as: Pushing a lawn mower. Raking and bagging leaves. Washing your car. Pushing a stroller. Shoveling snow. Gardening. Washing windows or floors. How can I be more active in my day-to-day activities? Use stairs instead of an elevator. Take a walk during your lunch break. If you drive, park your car farther away from your work or school. If you take public transportation, get off one stop early and walk the rest of the way. Stand up or walk around during all of   your indoor phone calls. Get up, stretch, and walk  around every 30 minutes throughout the day. Enjoy exercise with a friend. Support to continue exercising will help you keep a regular routine of activity. Where to find more information You can find more information about exercising to stay healthy from: U.[place] Department of Health and Human Services: www.hhs.gov Centers for Disease Control and Prevention (CDC): www.cdc.gov Summary Exercising regularly is important. It will improve your overall fitness, flexibility, and endurance. Regular exercise will also improve your overall health. It can help you control your weight, reduce stress, and improve your bone density. Do not exercise so much that you hurt yourself, feel dizzy, or get very short of breath. Before you start a new exercise program, talk with your health care provider. This information is not intended to replace advice given to you by your health care provider. Make sure you discuss any questions you have with your health care provider. Document Revised: 09/27/2020 Document Reviewed: 09/27/2020 Elsevier Patient Education  2022 [employer]. Budget-Friendly Healthy Eating There are many ways to save money at the grocery store and continue to eat healthy. You can be successful if you: Plan meals according to your budget. Make a grocery list and only purchase food according to your grocery list. Prepare food yourself at home. What are tips for following this plan? Reading food labels Compare food labels between brand name foods and the store brand. Often the nutritional value is the same, but the store brand is lower cost. Look for products that do not have added sugar, fat, or salt (sodium). These often cost the same but are healthier for you. Products may be labeled as: Sugar-free. Nonfat. Low-fat. Sodium-free. Low-sodium. Look for lean ground beef labeled as at least 92% lean and 8% fat. Shopping  Buy only the items on your grocery list and go only to the areas of the store  that have the items on your list. Use coupons only for foods and brands you normally buy. Avoid buying items you wouldn't normally buy simply because they are on sale. Check online and in newspapers for weekly deals. Buy healthy items from the bulk bins when available, such as herbs, spices, flour, pasta, nuts, and dried fruit. Buy fruits and vegetables that are in season. Prices are usually lower on in-season produce. Look at the unit price on the price tag. Use it to compare different brands and sizes to find out which item is the best deal. Choose healthy items that are often low-cost, such as carrots, potatoes, apples, bananas, and oranges. Dried or canned beans are a low-cost protein source. Buy in bulk and freeze extra food. Items you can buy in bulk include meats, fish, poultry, frozen fruits, and frozen vegetables. Avoid buying "ready-to-eat" foods, such as pre-cut fruits and vegetables and pre-made salads. If possible, shop around to discover where you can find the best prices. Consider other retailers such as dollar stores, larger wholesale stores, local fruit and vegetable stands, and farmers markets. Do not shop when you are hungry. If you shop while hungry, it may be hard to stick to your list and budget. Resist impulse buying. Use your grocery list as your official plan for the week. Buy a variety of vegetables and fruits by purchasing fresh, frozen, and canned items. Look at the top and bottom shelves for deals. Foods at eye level (eye level of an adult or child) are usually more expensive. Be efficient with your time when shopping. The more time you   spend at the store, the more money you are likely to spend. To save money when choosing more expensive foods like meats and dairy: Choose cheaper cuts of meat, such as bone-in chicken thighs and drumsticks instead of skinless and boneless chicken. When you are ready to prepare the chicken, you can remove the skin yourself to make it  healthier. Choose lean meats like chicken or turkey instead of beef. Choose canned seafood, such as tuna, salmon, or sardines. Buy eggs as a low-cost source of protein. Buy dried beans and peas, such as lentils, split peas, or kidney beans instead of meats. Dried beans and peas are a good alternative source of protein. Buy the larger tubs of yogurt instead of individual-sized containers. Choose water instead of sodas and other sweetened beverages. Avoid buying chips, cookies, and other "junk food." These items are usually expensive and not healthy. Cooking Make extra food and freeze the extras in meal-sized containers or in individual portions for fast meals and snacks. Pre-cook on days when you have extra time to prepare meals in advance. You can keep these meals in the fridge or freezer and reheat for a quick meal. When you come home from the grocery store, wash, peel, and cut fruits and vegetables so they are ready to use and eat. This will help reduce food waste. Meal planning Do not eat out or get fast food. Prepare food at home. Make a grocery list and make sure to bring it with you to the store. If you have a smart phone, you could use your phone to create your shopping list. Plan meals and snacks according to a grocery list and budget you create. Use leftovers in your meal plan for the week. Look for recipes where you can cook once and make enough food for two meals. Prepare budget-friendly types of meals like stews, casseroles, and stir-fry dishes. Try some meatless meals or try "no cook" meals like salads. Make sure that half your plate is filled with fruits or vegetables. Choose from fresh, frozen, or canned fruits and vegetables. If eating canned, remember to rinse them before eating. This will remove any excess salt added for packaging. Summary Eating healthy on a budget is possible if you plan your meals according to your budget, purchase according to your budget and grocery list,  and prepare food yourself. Tips for buying more food on a limited budget include buying generic brands, using coupons only for foods you normally buy, and buying healthy items from the bulk bins when available. Tips for buying cheaper food to replace expensive food include choosing cheaper, lean cuts of meat, and buying dried beans and peas. This information is not intended to replace advice given to you by your health care provider. Make sure you discuss any questions you have with your health care provider. Document Revised: 03/14/2020 Document Reviewed: 03/14/2020 Elsevier Patient Education  2022 [employer]. Bone Health Bones protect organs, store calcium, anchor muscles, and support the whole body. Keeping your bones strong is important, especially as you get older. You can take actions to help keep your bones strong and healthy. Why is keeping my bones healthy important? Keeping your bones healthy is important because your body constantly replaces bone cells. Cells get old, and new cells take their place. As we age, we lose bone cells because the body may not be able to make enough new cells to replace the old cells. The amount of bone cells and bone tissue you have is referred to as   bone mass. The higher your bone mass, the stronger your bones. The aging process leads to an overall loss of bone mass in the body, which can increase the likelihood of: Joint pain and stiffness. Broken bones. A condition in which the bones become weak and brittle (osteoporosis). A large decline in bone mass occurs in older adults. In women, it occurs about the time of menopause. What actions can I take to keep my bones healthy? Good health habits are important for maintaining healthy bones. This includes eating nutritious foods and exercising regularly. To have healthy bones, you need to get enough of the right minerals and vitamins. Most nutrition experts recommend getting these nutrients from the foods that  you eat. In some cases, taking supplements may also be recommended. Doing certain types of exercise is also important for bone health. What are the nutritional recommendations for healthy bones? Eating a well-balanced diet with plenty of calcium and vitamin D will help to protect your bones. Nutritional recommendations vary from person to person. Ask your health care provider what is healthy for you. Here are some general guidelines. Get enough calcium Calcium is the most important (essential) mineral for bone health. Most people can get enough calcium from their diet, but supplements may be recommended for people who are at risk for osteoporosis. Good sources of calcium include: Dairy products, such as low-fat or nonfat milk, cheese, and yogurt. Dark green leafy vegetables, such as bok choy and broccoli. Calcium-fortified foods, such as orange juice, cereal, bread, soy beverages, and tofu products. Nuts, such as almonds. Follow these recommended amounts for daily calcium intake: Children, age 1-3: 700 mg. Children, age 4-8: 1,000 mg. Children, age 9-13: 1,300 mg. Teens, age 14-18: 1,300 mg. Adults, age 19-50: 1,000 mg. Adults, age 51-70: Men: 1,000 mg. Women: 1,200 mg. Adults, age 71 or older: 1,200 mg. Pregnant and breastfeeding females: Teens: 1,300 mg. Adults: 1,000 mg. Get enough vitamin D Vitamin D is the most essential vitamin for bone health. It helps the body absorb calcium. Sunlight stimulates the skin to make vitamin D, so be sure to get enough sunlight. If you live in a cold climate or you do not get outside often, your health care provider may recommend that you take vitamin D supplements. Good sources of vitamin D in your diet include: Egg yolks. Saltwater fish. Milk and cereal fortified with vitamin D. Follow these recommended amounts for daily vitamin D intake: Children and teens, age 1-18: 600 international units. Adults, age 50 or younger: 400-800 international  units. Adults, age 51 or older: 800-1,000 international units. Get other important nutrients Other nutrients that are important for bone health include: Phosphorus. This mineral is found in meat, poultry, dairy foods, nuts, and legumes. The recommended daily intake for adult men and adult women is 700 mg. Magnesium. This mineral is found in seeds, nuts, dark green vegetables, and legumes. The recommended daily intake for adult men is 400-420 mg. For adult women, it is 310-320 mg. Vitamin K. This vitamin is found in green leafy vegetables. The recommended daily intake is 120 mg for adult men and 90 mg for adult women. What type of physical activity is best for building and maintaining healthy bones? Weight-bearing and strength-building activities are important for building and maintaining healthy bones. Weight-bearing activities cause muscles and bones to work against gravity. Strength-building activities increase the strength of the muscles that support bones. Weight-bearing and muscle-building activities include: Walking and hiking. Jogging and running. Dancing. Gym exercises. Lifting weights. Tennis   and racquetball. Climbing stairs. Aerobics. Adults should get at least 30 minutes of moderate physical activity on most days. Children should get at least 60 minutes of moderate physical activity on most days. Ask your health care provider what type of exercise is best for you. How can I find out if my bone mass is low? Bone mass can be measured with an X-ray test called a bone mineral density (BMD) test. This test is recommended for all women who are age 65 or older. It may also be recommended for: Men who are age 70 or older. People who are at risk for osteoporosis because of: Having bones that break easily. Having a long-term disease that weakens bones, such as kidney disease or rheumatoid arthritis. Having menopause earlier than normal. Taking medicine that weakens bones, such as steroids,  thyroid hormones, or hormone treatment for breast cancer or prostate cancer. Smoking. Drinking three or more alcoholic drinks a day. If you find that you have a low bone mass, you may be able to prevent osteoporosis or further bone loss by changing your diet and lifestyle. Where can I find more information? For more information, check out the following websites: National Osteoporosis Foundation: www.nof.org/patients National Institutes of Health: www.bones.nih.gov International Osteoporosis Foundation: www.iofbonehealth.org Summary The aging process leads to an overall loss of bone mass in the body, which can increase the likelihood of broken bones and osteoporosis. Eating a well-balanced diet with plenty of calcium and vitamin D will help to protect your bones. Weight-bearing and strength-building activities are also important for building and maintaining strong bones. Bone mass can be measured with an X-ray test called a bone mineral density (BMD) test. This information is not intended to replace advice given to you by your health care provider. Make sure you discuss any questions you have with your health care provider. Document Revised: 06/28/2017 Document Reviewed: 06/28/2017 Elsevier Patient Education  2022 [employer].  

## 2021-03-18 NOTE — Progress Notes (Signed)
Gynecology Annual Exam  PCP: Schermerhorn, Ihor Austin, MD  Chief Complaint:  Chief Complaint  Patient presents with   Gynecologic Exam    History of Present Illness: Patient is a 50 y.o. No obstetric history on file. She presents for annual exam. The patient reports she has been detoxing off of one of her antidepressant medications. She would like to have lab work done today. 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 She has had consultation with [facility] about this in the past. She reports she would like a set of fresh eyes. She reports she had a hysterectomy related to problems with cervical dysplasia in the past.   LMP: No LMP recorded. Patient has had a hysterectomy. She denies postmenopausal bleeding or spotting  The patient is not currently sexually active. She denies dyspareunia.  Postcoital Bleeding: no   The patient does perform self breast exams.  There is no notable family history of breast or ovarian cancer in her family.  The patient has regular exercise: walking  2 times a week Review of Systems: Review of Systems  Constitutional:  Positive for malaise/fatigue. Negative for chills, fever and weight loss.  HENT:  Positive for congestion. Negative for hearing loss and sinus pain.   Eyes:  Negative for blurred vision and double vision.  Respiratory:  Positive for cough. Negative for sputum production, shortness of breath and wheezing.   Cardiovascular:  Negative for chest pain, palpitations, orthopnea and leg swelling.  Gastrointestinal:  Negative for abdominal pain, constipation, diarrhea, nausea and vomiting.  Genitourinary:  Positive for frequency. Negative for dysuria, flank pain, hematuria and urgency.  Musculoskeletal:  Positive for joint pain and myalgias. Negative for back pain and falls.  Skin:  Negative for itching and rash.  Neurological:  Negative for dizziness and headaches.  Endo/Heme/Allergies:  Positive for environmental  allergies.  Psychiatric/Behavioral:  Positive for depression. Negative for substance abuse and suicidal ideas. The patient is nervous/anxious.    Past Medical History:  Past Medical History:  Diagnosis Date   Abscess of right breast unrelated to pregnancy or breastfeeding 01/20/2018   ADD (attention deficit disorder)    Alcohol use disorder, moderate, dependence (HCC) 01/23/2016   Allergy    Anemia    Anxiety    Cervical dysplasia    Depression    DVT (deep venous thrombosis) (HCC)    Fever blister    GERD (gastroesophageal reflux disease)    History of secondary skin cancer    IBS (irritable bowel syndrome)    Inclusion cyst of vulva    MRSA infection    spider bite on breast   Severe recurrent major depression without psychotic features (HCC) 01/23/2016   Social anxiety disorder 01/24/2016   Suicidal ideation 01/23/2016   VAIN II (vaginal intraepithelial neoplasia grade II)     Past Surgical History:  Past Surgical History:  Procedure Laterality Date   ABDOMINAL HYSTERECTOMY     colonoscopy with polypectomy     COLONOSCOPY WITH PROPOFOL N/A 10/25/2019   Procedure: COLONOSCOPY WITH PROPOFOL;  Surgeon: Toledo, Boykin Nearing, MD;  Location: [facility];  Service: Gastroenterology;  Laterality: N/A;   ESOPHAGOGASTRODUODENOSCOPY (EGD) WITH PROPOFOL N/A 10/25/2019   Procedure: ESOPHAGOGASTRODUODENOSCOPY (EGD) WITH PROPOFOL;  Surgeon: Toledo, Boykin Nearing, MD;  Location: [facility] ENDOSCOPY;  Service: Gastroenterology;  Laterality: N/A;   SKIN CANCER EXCISION     Pt indicated it was removed from her buttocks   TONSILLECTOMY  TUBAL LIGATION      Gynecologic History:  No LMP recorded. Patient has had a hysterectomy.   Obstetric History: No obstetric history on file.  Family History:  Family History  Problem Relation Age of Onset   Mental illness Mother    Hypertension Father    Diabetes Maternal Grandfather    Breast cancer Neg Hx     Social History:  Social History    Socioeconomic History   Marital status: Divorced    Spouse name: Not on file   Number of children: Not on file   Years of education: Not on file   Highest education level: Not on file  Occupational History   Not on file  Tobacco Use   Smoking status: Every Day    Packs/day: 1.00    Types: Cigarettes   Smokeless tobacco: Never  Vaping Use   Vaping Use: Every day  Substance and Sexual Activity   Alcohol use: Yes    Alcohol/week: 12.0 standard drinks    Types: 12 Standard drinks or equivalent per week    Comment: 2-3 times a week varies wine, beer or blood mary   Drug use: No   Sexual activity: Not Currently  Other Topics Concern   Not on file  Social History Narrative   Not on file   Social Determinants of Health   Financial Resource Strain: Not on file  Food Insecurity: Not on file  Transportation Needs: Not on file  Physical Activity: Not on file  Stress: Not on file  Social Connections: Not on file  Intimate Partner Violence: Not on file    Allergies:  Allergies  Allergen Reactions   Amoxicillin-Pot Clavulanate Nausea Only   Ancef [Cefazolin]    Moxifloxacin Other (See Comments)    agitation    Medications: Prior to Admission medications   Medication Sig Start Date End Date Taking? Authorizing Provider  acyclovir (ZOVIRAX) 400 MG tablet Take 400 mg by mouth as needed.  01/14/17  Yes [provider]  ALPRAZolam Prudy Feeler) 0.5 MG tablet Take 0.5 mg by mouth as needed.  10/30/14  Yes [provider]  amphetamine-dextroamphetamine (ADDERALL) 10 MG tablet Take 10 mg by mouth 3 (three) times daily as needed (mostly as needed but prescribed TID).  02/04/17  Yes [provider]  ciprofloxacin-dexamethasone (CIPRODEX) OTIC suspension Place 4 drops into both ears 2 (two) times daily. 02/29/20  Yes Lutricia Feil, PA-C  estradiol (ESTRACE) 1 MG tablet Take 1 tablet (1 mg total) by mouth daily. 01/25/16  Yes Clapacs, Jackquline Denmark, MD  fluticasone  (FLONASE) 50 MCG/ACT nasal spray Place 2 sprays into the nose as needed.  07/29/16  Yes [provider]  pantoprazole (PROTONIX) 40 MG tablet Take 1 tablet (40 mg total) by mouth daily. 01/25/16  Yes Clapacs, Jackquline Denmark, MD  diphenhydrAMINE (BENADRYL ALLERGY) 25 MG tablet Take 1 tablet (25 mg total) by mouth every 6 (six) hours as needed for up to 5 days. 01/15/18 02/29/20  Orvil Feil, PA-C  DULoxetine (CYMBALTA) 60 MG capsule Take 1 capsule (60 mg total) by mouth daily. Patient not taking: Reported on 03/18/2021 01/25/16   Clapacs, Jackquline Denmark, MD  folic acid (FOLVITE) 1 MG tablet Take 1 tablet (1 mg total) by mouth daily. Patient not taking: Reported on 03/18/2021 01/25/16   Clapacs, Jackquline Denmark, MD  ketorolac (TORADOL) 10 MG tablet Take 1 tablet (10 mg total) by mouth every 6 (six) hours as needed for moderate pain or severe pain. Patient  not taking: Reported on 03/18/2021 12/25/20   Tommie Sams, DO  famotidine (PEPCID) 20 MG tablet Take 1 tablet (20 mg total) by mouth 2 (two) times daily for 5 days. 01/15/18 02/29/20  Orvil Feil, PA-C    Physical Exam Vitals: Blood pressure 115/72, height 5' (1.524 m), weight 148 lb 12.8 oz (67.5 kg).  Physical Exam Genitourinary:     Genitourinary Comments: Bilateral labia with irregular small multiple growths and thickening, wil need biopsy Normal vagina- vaginal pap performed     Female chaperone present for pelvic and breast  portions of the physical exam  Assessment: 50 y.o. No obstetric history on file. routine annual exam  Plan: Problem List Items Addressed This Visit   None Visit Diagnoses     Encounter for annual routine gynecological examination    -  Primary   Health maintenance examination       Screening for diabetes mellitus       Relevant Orders   Comprehensive metabolic panel (Completed)   Hemoglobin A1c (Completed)   Screening for thyroid disorder       Relevant Orders   TSH (Completed)   Screening cholesterol level        Relevant Orders   Lipid panel (Completed)   Encounter for gynecological examination without abnormal finding       Screening for deficiency anemia       Relevant Orders   CBC With Differential (Completed)   Breast cancer screening by mammogram       Relevant Orders   MM 3D SCREEN BREAST BILATERAL   VAIN (vaginal intraepithelial neoplasia)       Relevant Orders   Cytology - PAP (Completed)   Vulvar intraepithelial neoplasia (VIN)           1) Mammogram - recommend yearly screening mammogram.  Mammogram Was ordered today  2) STI screening was offered and declined  3) Pap smear today  4) Colonoscopy -- up to date  5) Routine healthcare maintenance including cholesterol, diabetes screening discussed Ordered today  6) Osteoporosis screening - start at 65  7) Vulvar abnormality- Return for vulvar biopsy.    Adelene Idler MD, [facility], [facility] 09/25/2021 2:10 PM

## 2021-03-19 LAB — CBC WITH DIFFERENTIAL
Basophils Absolute: 0.1 10*3/uL (ref 0.0–0.2)
Basos: 1 %
EOS (ABSOLUTE): 0.2 10*3/uL (ref 0.0–0.4)
Eos: 2 %
Hematocrit: 39.4 % (ref 34.0–46.6)
Hemoglobin: 13.2 g/dL (ref 11.1–15.9)
Immature Grans (Abs): 0 10*3/uL (ref 0.0–0.1)
Immature Granulocytes: 0 %
Lymphocytes Absolute: 3 10*3/uL (ref 0.7–3.1)
Lymphs: 39 %
MCH: 31.1 pg (ref 26.6–33.0)
MCHC: 33.5 g/dL (ref 31.5–35.7)
MCV: 93 fL (ref 79–97)
Monocytes Absolute: 0.6 10*3/uL (ref 0.1–0.9)
Monocytes: 7 %
Neutrophils Absolute: 3.9 10*3/uL (ref 1.4–7.0)
Neutrophils: 51 %
RBC: 4.24 x10E6/uL (ref 3.77–5.28)
RDW: 12.7 % (ref 11.7–15.4)
WBC: 7.7 10*3/uL (ref 3.4–10.8)

## 2021-03-19 LAB — LIPID PANEL
Chol/HDL Ratio: 3.9 ratio (ref 0.0–4.4)
Cholesterol, Total: 241 mg/dL — ABNORMAL HIGH (ref 100–199)
HDL: 62 mg/dL (ref >39)
LDL Chol Calc (NIH): 150 mg/dL — ABNORMAL HIGH (ref 0–99)
Triglycerides: 164 mg/dL — ABNORMAL HIGH (ref 0–149)
VLDL Cholesterol Cal: 29 mg/dL (ref 5–40)

## 2021-03-19 LAB — COMPREHENSIVE METABOLIC PANEL
ALT: 23 IU/L (ref 0–32)
AST: 21 IU/L (ref 0–40)
Albumin/Globulin Ratio: 1.9 (ref 1.2–2.2)
Albumin: 4.9 g/dL (ref 3.8–4.9)
Alkaline Phosphatase: 54 IU/L (ref 44–121)
BUN/Creatinine Ratio: 17 (ref 9–23)
BUN: 11 mg/dL (ref 6–24)
Bilirubin Total: 0.3 mg/dL (ref 0.0–1.2)
CO2: 23 mmol/L (ref 20–29)
Calcium: 9.6 mg/dL (ref 8.7–10.2)
Chloride: 101 mmol/L (ref 96–106)
Creatinine, Ser: 0.63 mg/dL (ref 0.57–1.00)
Globulin, Total: 2.6 g/dL (ref 1.5–4.5)
Glucose: 87 mg/dL (ref 70–99)
Potassium: 4.7 mmol/L (ref 3.5–5.2)
Sodium: 141 mmol/L (ref 134–144)
Total Protein: 7.5 g/dL (ref 6.0–8.5)
eGFR: 107 mL/min/{1.73_m2} (ref >59)

## 2021-03-19 LAB — HEMOGLOBIN A1C
Est. average glucose Bld gHb Est-mCnc: 114 mg/dL
Hgb A1c MFr Bld: 5.6 % (ref 4.8–5.6)

## 2021-03-19 LAB — TSH: TSH: 1 u[IU]/mL (ref 0.450–4.500)

## 2021-03-24 ENCOUNTER — Telehealth: Payer: Self-pay | Admitting: Obstetrics and Gynecology

## 2021-03-24 NOTE — Telephone Encounter (Signed)
Called pt to schedule the procedure, but she is not wanting to schedule at this time.  Waiting on pap smear results and experiencing some work changes.  Will let us know when she wants to schedule.

## 2021-03-28 LAB — CYTOLOGY - PAP
Comment: NEGATIVE
High risk HPV: POSITIVE — AB

## 2021-04-29 ENCOUNTER — Other Ambulatory Visit: Payer: Self-pay | Admitting: Obstetrics and Gynecology

## 2021-04-29 MED ORDER — ESTRADIOL 0.5 MG PO TABS: 0.5000 mg | ORAL_TABLET | Freq: Every day | ORAL | 11 refills | Status: DC

## 2021-04-29 NOTE — Telephone Encounter (Signed)
Patient is calling to follow up on My chart message. Patient states no one one is letting her know about a refill on her estradiol. Patient states she has 2 weeks left but doesn't want to run out. Patient is aware CRS is in clinic seeing patient. Advise patient I would send message to provider.

## 2021-04-29 NOTE — Progress Notes (Signed)
Estradiol refill sent

## 2021-05-22 NOTE — Telephone Encounter (Signed)
No further documentation needed

## 2021-05-24 ENCOUNTER — Other Ambulatory Visit: Payer: Self-pay | Admitting: Obstetrics and Gynecology

## 2021-11-20 DIAGNOSIS — M5412 Radiculopathy, cervical region: Secondary | ICD-10-CM | POA: Diagnosis not present

## 2021-11-20 DIAGNOSIS — M25512 Pain in left shoulder: Secondary | ICD-10-CM | POA: Diagnosis not present

## 2021-11-20 DIAGNOSIS — M25511 Pain in right shoulder: Secondary | ICD-10-CM | POA: Diagnosis not present

## 2022-01-08 ENCOUNTER — Other Ambulatory Visit: Payer: Self-pay

## 2022-01-08 MED ORDER — ESTRADIOL 0.5 MG PO TABS: 0.5000 mg | ORAL_TABLET | Freq: Every day | ORAL | 3 refills | Status: DC

## 2022-01-21 IMAGING — MG DIGITAL SCREENING BILAT W/ TOMO W/ CAD
8 series · 8 of 24 positions shown · non-contrast
Comparison: Previous exam(s).

CLINICAL DATA: Screening.

EXAM:
DIGITAL SCREENING BILATERAL MAMMOGRAM WITH TOMO AND CAD

[L CC synth-2D]
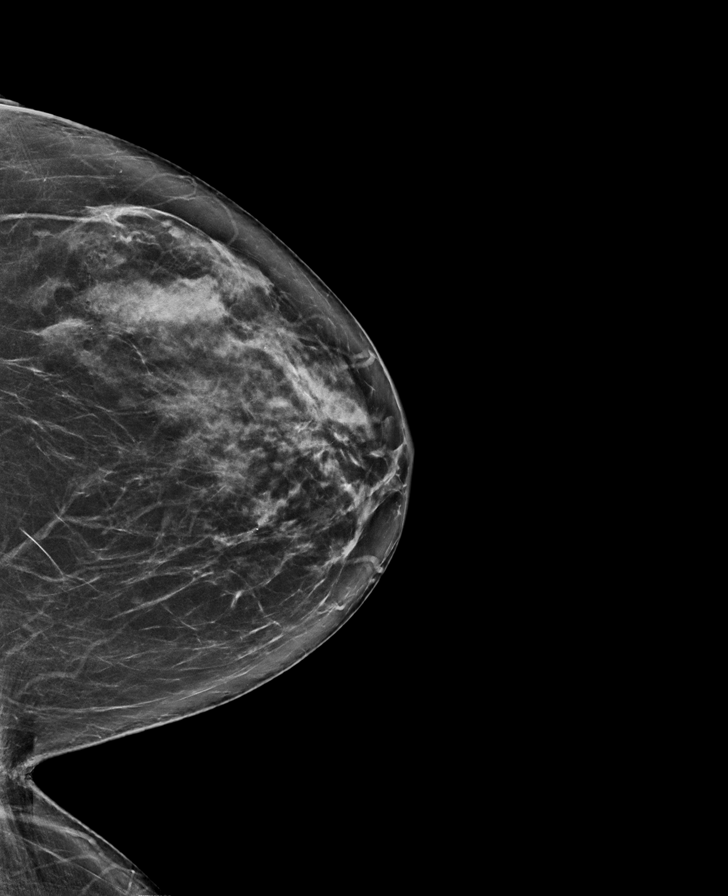

[L MLO synth-2D]
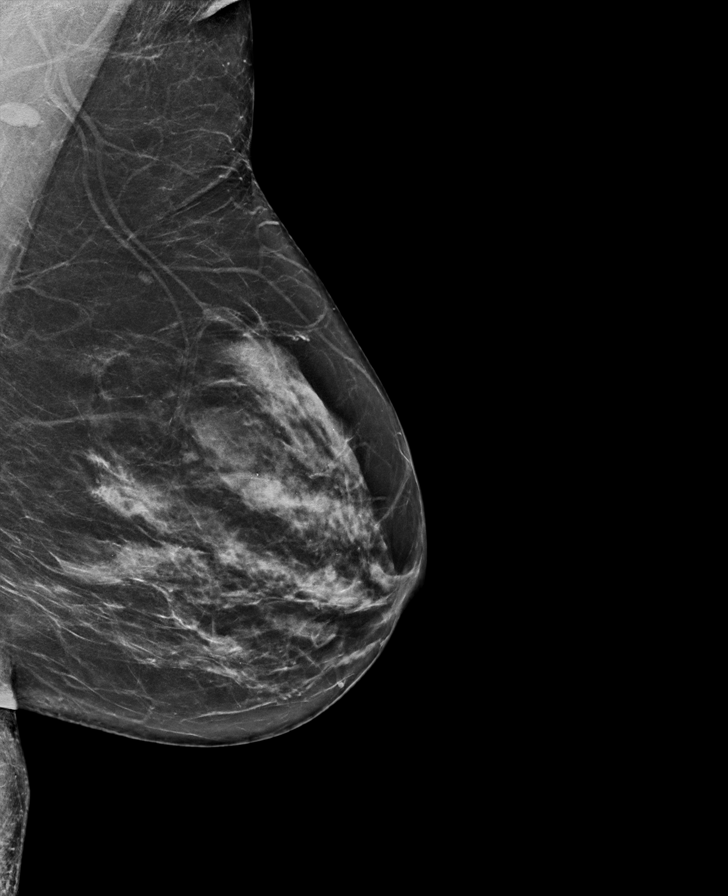

[R MLO synth-2D]
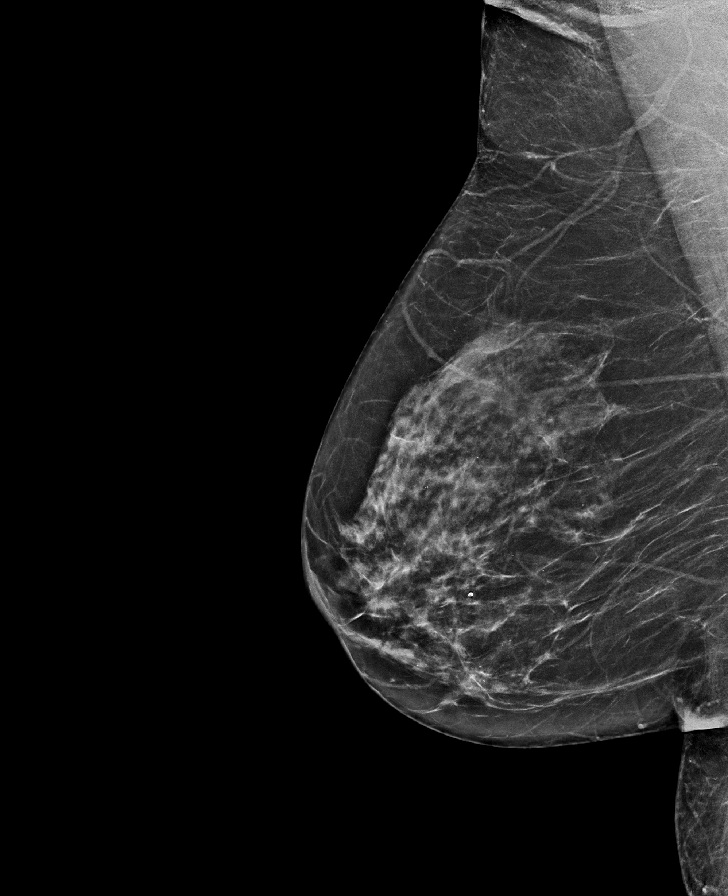

[R CC synth-2D]
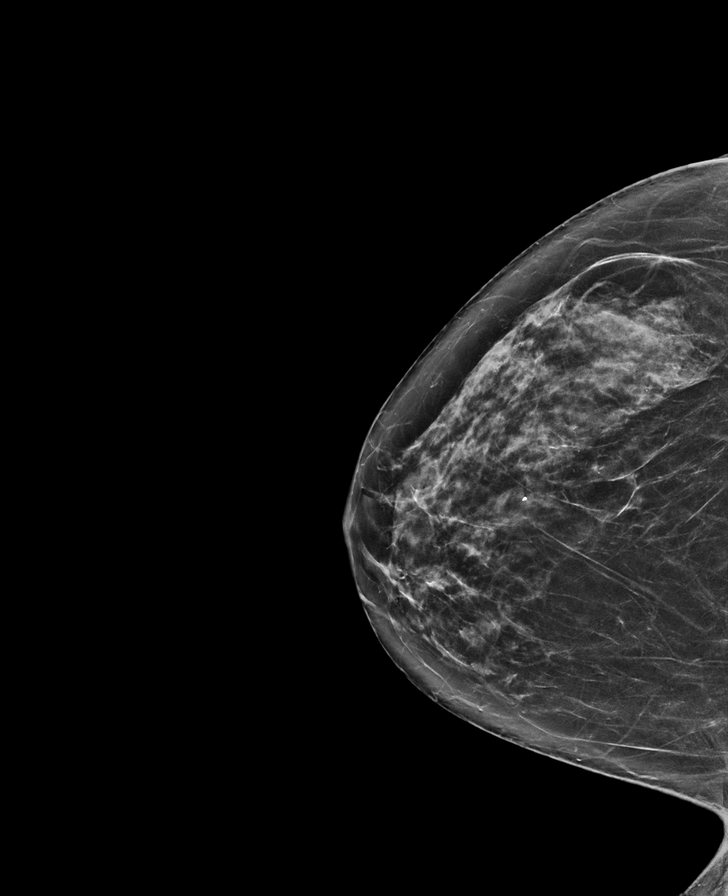

[L MLO tomo · tomo slice 37/72.0]
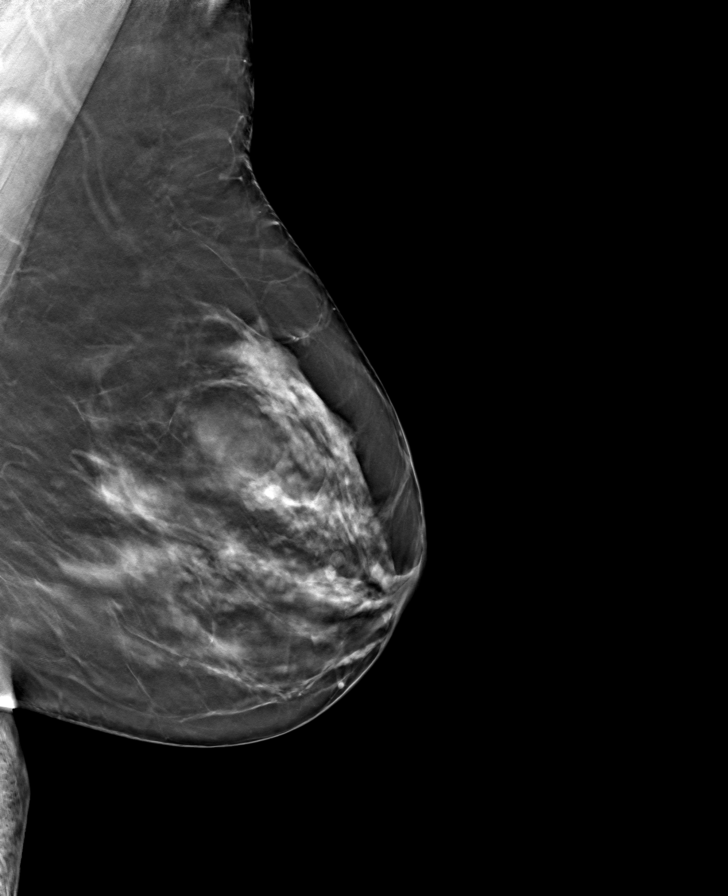

[L CC tomo · tomo slice 37/74.0]
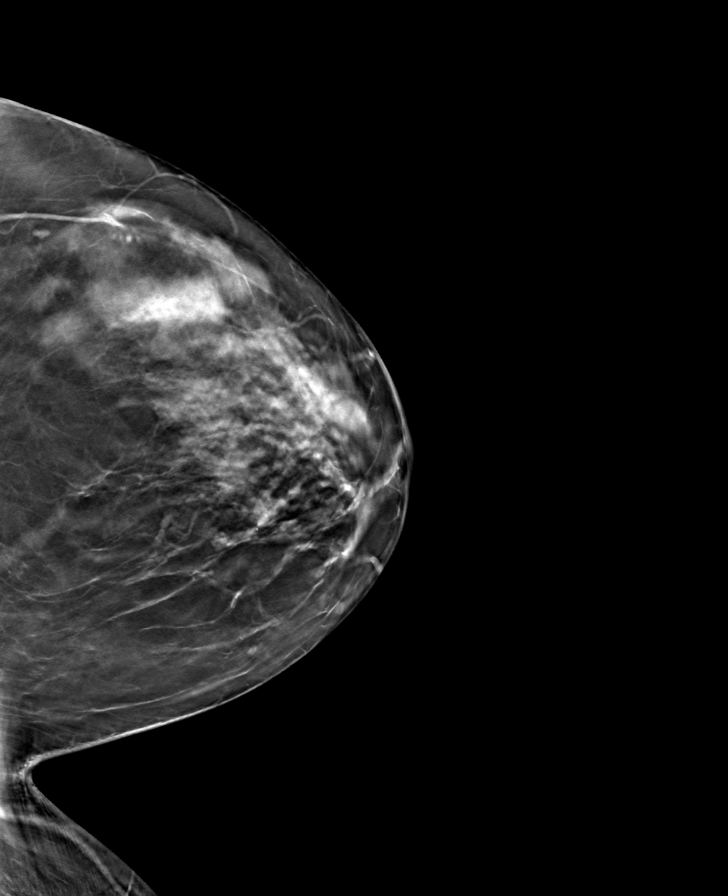

[R CC tomo · tomo slice 35/69.0]
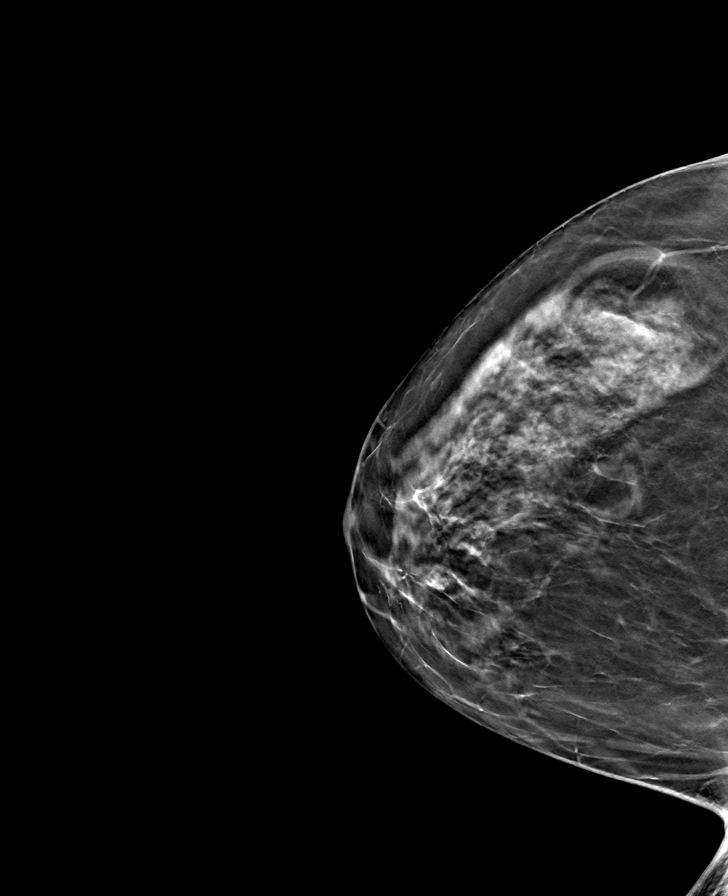

[R MLO tomo · tomo slice 35/69.0]
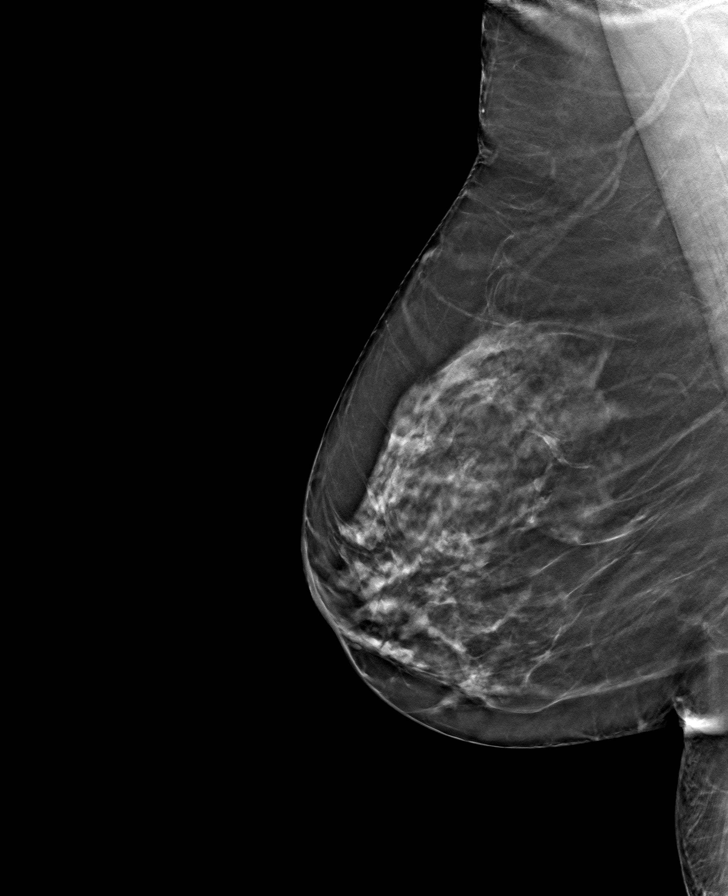

[8 of 24 positions shown; findings below may reference images not displayed]

ACR Breast Density Category c: The breast tissue is heterogeneously
dense, which may obscure small masses.
FINDINGS: There are no findings suspicious for malignancy. Images were
processed with CAD.
IMPRESSION: No mammographic evidence of malignancy. A result letter of this
screening mammogram will be mailed directly to the patient.

RECOMMENDATION:
Screening mammogram in one year. (Code:[id number]-U-LHB)

BI-RADS CATEGORY  1: Negative.

## 2022-03-11 DIAGNOSIS — M25511 Pain in right shoulder: Secondary | ICD-10-CM | POA: Diagnosis not present

## 2022-03-11 DIAGNOSIS — M5412 Radiculopathy, cervical region: Secondary | ICD-10-CM | POA: Diagnosis not present

## 2022-03-11 DIAGNOSIS — M25512 Pain in left shoulder: Secondary | ICD-10-CM | POA: Diagnosis not present

## 2022-06-12 DIAGNOSIS — J101 Influenza due to other identified influenza virus with other respiratory manifestations: Secondary | ICD-10-CM | POA: Diagnosis not present

## 2022-06-12 DIAGNOSIS — M25512 Pain in left shoulder: Secondary | ICD-10-CM | POA: Diagnosis not present

## 2022-06-12 DIAGNOSIS — R059 Cough, unspecified: Secondary | ICD-10-CM | POA: Diagnosis not present

## 2022-06-12 DIAGNOSIS — M25511 Pain in right shoulder: Secondary | ICD-10-CM | POA: Diagnosis not present

## 2022-06-12 DIAGNOSIS — M5412 Radiculopathy, cervical region: Secondary | ICD-10-CM | POA: Diagnosis not present

## 2022-06-12 DIAGNOSIS — R519 Headache, unspecified: Secondary | ICD-10-CM | POA: Diagnosis not present

## 2022-06-12 DIAGNOSIS — M24812 Other specific joint derangements of left shoulder, not elsewhere classified: Secondary | ICD-10-CM | POA: Diagnosis not present

## 2022-06-12 DIAGNOSIS — M24811 Other specific joint derangements of right shoulder, not elsewhere classified: Secondary | ICD-10-CM | POA: Diagnosis not present

## 2022-06-12 DIAGNOSIS — M542 Cervicalgia: Secondary | ICD-10-CM | POA: Diagnosis not present

## 2022-07-10 ENCOUNTER — Other Ambulatory Visit: Payer: Self-pay | Admitting: Advanced Practice Midwife

## 2022-08-12 ENCOUNTER — Other Ambulatory Visit: Payer: Self-pay | Admitting: Advanced Practice Midwife

## 2022-09-16 ENCOUNTER — Other Ambulatory Visit: Payer: Self-pay | Admitting: Advanced Practice Midwife

## 2022-10-19 ENCOUNTER — Other Ambulatory Visit: Payer: Self-pay | Admitting: Advanced Practice Midwife

## 2022-10-30 DIAGNOSIS — H04223 Epiphora due to insufficient drainage, bilateral lacrimal glands: Secondary | ICD-10-CM | POA: Diagnosis not present

## 2022-10-30 DIAGNOSIS — H10433 Chronic follicular conjunctivitis, bilateral: Secondary | ICD-10-CM | POA: Diagnosis not present

## 2022-10-30 DIAGNOSIS — H02829 Cysts of unspecified eye, unspecified eyelid: Secondary | ICD-10-CM | POA: Diagnosis not present

## 2022-10-30 DIAGNOSIS — M3501 Sicca syndrome with keratoconjunctivitis: Secondary | ICD-10-CM | POA: Diagnosis not present

## 2022-11-12 DIAGNOSIS — D23112 Other benign neoplasm of skin of right lower eyelid, including canthus: Secondary | ICD-10-CM | POA: Diagnosis not present

## 2022-11-21 ENCOUNTER — Other Ambulatory Visit: Payer: Self-pay | Admitting: Advanced Practice Midwife

## 2022-11-24 ENCOUNTER — Ambulatory Visit (INDEPENDENT_AMBULATORY_CARE_PROVIDER_SITE_OTHER): Payer: 59 | Admitting: Family Medicine

## 2022-11-24 ENCOUNTER — Other Ambulatory Visit: Payer: Self-pay | Admitting: Family Medicine

## 2022-11-24 VITALS — BP 126/83 | HR 74 | Ht 60.0 in | Wt 131.0 lb

## 2022-11-24 DIAGNOSIS — F17200 Nicotine dependence, unspecified, uncomplicated: Secondary | ICD-10-CM

## 2022-11-24 DIAGNOSIS — K219 Gastro-esophageal reflux disease without esophagitis: Secondary | ICD-10-CM | POA: Diagnosis not present

## 2022-11-24 DIAGNOSIS — Z114 Encounter for screening for human immunodeficiency virus [HIV]: Secondary | ICD-10-CM | POA: Diagnosis not present

## 2022-11-24 DIAGNOSIS — Z0000 Encounter for general adult medical examination without abnormal findings: Secondary | ICD-10-CM | POA: Insufficient documentation

## 2022-11-24 DIAGNOSIS — Z7689 Persons encountering health services in other specified circumstances: Secondary | ICD-10-CM

## 2022-11-24 DIAGNOSIS — B009 Herpesviral infection, unspecified: Secondary | ICD-10-CM

## 2022-11-24 DIAGNOSIS — J309 Allergic rhinitis, unspecified: Secondary | ICD-10-CM | POA: Diagnosis not present

## 2022-11-24 DIAGNOSIS — Z1231 Encounter for screening mammogram for malignant neoplasm of breast: Secondary | ICD-10-CM | POA: Diagnosis not present

## 2022-11-24 DIAGNOSIS — Z1159 Encounter for screening for other viral diseases: Secondary | ICD-10-CM | POA: Diagnosis not present

## 2022-11-24 HISTORY — DX: Persons encountering health services in other specified circumstances: Z76.89

## 2022-11-24 MED ORDER — CLOBETASOL PROPIONATE 0.05 % EX SOLN: 1.0000 | CUTANEOUS | 0 refills | Status: AC

## 2022-11-24 MED ORDER — PANTOPRAZOLE SODIUM 40 MG PO TBEC
40.0000 mg | DELAYED_RELEASE_TABLET | Freq: Every day | ORAL | 3 refills | Status: DC
Start: 2022-11-24 — End: 2023-03-09

## 2022-11-24 MED ORDER — FLUTICASONE PROPIONATE 50 MCG/ACT NA SUSP
2.0000 | Freq: Every day | NASAL | 6 refills | Status: AC
Start: 2022-11-24 — End: ?

## 2022-11-24 MED ORDER — VALACYCLOVIR HCL 1 G PO TABS
1000.0000 mg | ORAL_TABLET | Freq: Two times a day (BID) | ORAL | 0 refills | Status: AC
Start: 2022-11-24 — End: 2022-12-04

## 2022-11-24 NOTE — Assessment & Plan Note (Signed)
Chronic, stable Request for protonix 40 mg refills

## 2022-11-24 NOTE — Assessment & Plan Note (Signed)

## 2022-11-24 NOTE — Assessment & Plan Note (Signed)
Low risk screen ?Consented; encouraged to "know your status" ?Recommend repeat screen if risk factors change ? ?

## 2022-11-24 NOTE — Progress Notes (Signed)
New patient visit   Patient: Laura Lloyd   DOB: June 01, 1971   52 y.o. Female  MRN: [id number] Visit Date: 11/24/2022  Today's healthcare provider: Jacky Kindle, FNP  Patient presents for new patient visit to establish care.  Introduced to Publishing rights manager role and practice setting.  All questions answered.  Discussed provider/patient relationship and expectations.  Chief Complaint  Patient presents with   New Patient (Initial Visit)   Pt notes remote hx of severe AUD and SI; no longer a concern per pt report. Followed by psych.  Subjective    Laura Lloyd is a 52 y.o. female who presents today as a new patient to establish care.  HPI HPI   establish Medication refill protonix, estradiol, acyclovir, flonase  Last edited by Shelly Bombard, CMA on 11/24/2022  2:15 PM.       Past Medical History:  Diagnosis Date   Abscess of right breast unrelated to pregnancy or breastfeeding 01/20/2018   ADD (attention deficit disorder)    Alcohol use disorder, moderate, dependence (HCC) 01/23/2016   Allergy    Anemia    Anxiety    Arthritis 2004   hands/back   Cervical dysplasia    Depression    DVT (deep venous thrombosis) (HCC)    Encounter to establish care 11/24/2022   Fever blister    GERD (gastroesophageal reflux disease)    History of secondary skin cancer    IBS (irritable bowel syndrome)    Inclusion cyst of vulva    MRSA infection    spider bite on breast   Severe recurrent major depression without psychotic features (HCC) 01/23/2016   Social anxiety disorder 01/24/2016   Suicidal ideation 01/23/2016   VAIN II (vaginal intraepithelial neoplasia grade II)    Past Surgical History:  Procedure Laterality Date   ABDOMINAL HYSTERECTOMY     colonoscopy with polypectomy     COLONOSCOPY WITH PROPOFOL N/A 10/25/2019   Procedure: COLONOSCOPY WITH PROPOFOL;  Surgeon: Toledo, Boykin Nearing, MD;  Location: [facility];  Service: Gastroenterology;  Laterality: N/A;    ESOPHAGOGASTRODUODENOSCOPY (EGD) WITH PROPOFOL N/A 10/25/2019   Procedure: ESOPHAGOGASTRODUODENOSCOPY (EGD) WITH PROPOFOL;  Surgeon: Toledo, Boykin Nearing, MD;  Location: [facility] ENDOSCOPY;  Service: Gastroenterology;  Laterality: N/A;   EYE SURGERY  1997   lasic   SKIN CANCER EXCISION     Pt indicated it was removed from her buttocks   TONSILLECTOMY     TUBAL LIGATION     Family Status  Relation Name Status   Mother  Deceased   Father Linward Deceased   MGM  Deceased   MGF RB Deceased   PGM  Deceased   PGF  Deceased   Neg Hx  (Not Specified)   Family History  Problem Relation Age of Onset   Mental illness Mother    Hypertension Father    Diabetes Maternal Grandfather    Breast cancer Neg Hx    Social History   Socioeconomic History   Marital status: Divorced    Spouse name: Not on file   Number of children: Not on file   Years of education: Not on file   Highest education level: Master's degree (e.g., MA, MS, MEng, MEd, MSW, MBA)  Occupational History   Not on file  Tobacco Use   Smoking status: Every Day    Types: E-cigarettes   Smokeless tobacco: Never  Vaping Use   Vaping Use: Every day  Substance and Sexual Activity   Alcohol use:  Not Currently    Alcohol/week: 12.0 standard drinks of alcohol    Types: 12 Standard drinks or equivalent per week    Comment: 2-3 times a week varies wine, beer or blood mary   Drug use: No   Sexual activity: Not Currently  Other Topics Concern   Not on file  Social History Narrative   Not on file   Social Determinants of Health   Financial Resource Strain: Medium Risk (11/24/2022)   Overall Financial Resource Strain (CARDIA)    Difficulty of Paying Living Expenses: Somewhat hard  Food Insecurity: No Food Insecurity (11/24/2022)   Hunger Vital Sign    Worried About Running Out of Food in the Last Year: Never true    Ran Out of Food in the Last Year: Never true  Transportation Needs: No Transportation Needs (11/24/2022)   PRAPARE -  Administrator, Civil Service (Medical): No    Lack of Transportation (Non-Medical): No  Physical Activity: Sufficiently Active (11/24/2022)   Exercise Vital Sign    Days of Exercise per Week: 7 days    Minutes of Exercise per Session: 150+ min  Stress: Stress Concern Present (11/24/2022)   [employer] of Occupational Health - Occupational Stress Questionnaire    Feeling of Stress : To some extent  Social Connections: Moderately Isolated (11/24/2022)   Social Connection and Isolation Panel [NHANES]    Frequency of Communication with Friends and Family: Twice a week    Frequency of Social Gatherings with Friends and Family: Once a week    Attends Religious Services: 1 to 4 times per year    Active Member of [employer] or Organizations: No    Attends Engineer, structural: Not on file    Marital Status: Divorced   Outpatient Medications Prior to Visit  Medication Sig Note   acyclovir (ZOVIRAX) 400 MG tablet Take 400 mg by mouth as needed.     ALPRAZolam (XANAX) 0.5 MG tablet Take 0.5 mg by mouth as needed.     amphetamine-dextroamphetamine (ADDERALL) 10 MG tablet Take 10 mg by mouth 3 (three) times daily as needed (mostly as needed but prescribed TID).     DULoxetine (CYMBALTA) 60 MG capsule Take 1 capsule (60 mg total) by mouth daily. 11/24/2022: Pt taking cymbalta 20 mg instead 60 mg.   estradiol (ESTRACE) 0.5 MG tablet TAKE 1 TABLET BY MOUTH EVERY DAY    [DISCONTINUED] ciprofloxacin-dexamethasone (CIPRODEX) OTIC suspension Place 4 drops into both ears 2 (two) times daily.    [DISCONTINUED] fluticasone (FLONASE) 50 MCG/ACT nasal spray Place 2 sprays into the nose as needed.     [DISCONTINUED] ketorolac (TORADOL) 10 MG tablet Take 1 tablet (10 mg total) by mouth every 6 (six) hours as needed for moderate pain or severe pain.    [DISCONTINUED] pantoprazole (PROTONIX) 40 MG tablet Take 1 tablet (40 mg total) by mouth daily.    [DISCONTINUED] diphenhydrAMINE (BENADRYL  ALLERGY) 25 MG tablet Take 1 tablet (25 mg total) by mouth every 6 (six) hours as needed for up to 5 days.    [DISCONTINUED] folic acid (FOLVITE) 1 MG tablet Take 1 tablet (1 mg total) by mouth daily. (Patient not taking: Reported on 03/18/2021)    No facility-administered medications prior to visit.   Allergies  Allergen Reactions   Amoxicillin-Pot Clavulanate Nausea Only   Ancef [Cefazolin]    Moxifloxacin Other (See Comments)    agitation     There is no immunization history on file for this patient.  Health Maintenance  Topic Date Due   COVID-19 Vaccine (1) Never done   HIV Screening  Never done   Hepatitis C Screening  Never done   DTaP/Tdap/Td (1 - Tdap) Never done   Zoster Vaccines- Shingrix (1 of 2) Never done   MAMMOGRAM  08/07/2021   INFLUENZA VACCINE  01/14/2023   PAP SMEAR-Modifier  03/18/2024   Colonoscopy  10/26/2029   HPV VACCINES  Aged Out    Patient Care Team: Jacky Kindle, FNP as PCP - General (Family Medicine)  Review of Systems   Objective    BP 126/83 (BP Location: Right Arm, Patient Position: Sitting, Cuff Size: Normal)   Pulse 74   Ht 5' (1.524 m)   Wt 131 lb (59.4 kg)   SpO2 100%   BMI 25.58 kg/m   Physical Exam Vitals and nursing note reviewed.  Constitutional:      General: She is awake. She is not in acute distress.    Appearance: Normal appearance. She is well-developed, well-groomed and overweight. She is not ill-appearing, toxic-appearing or diaphoretic.  HENT:     Head: Normocephalic and atraumatic.     Jaw: There is normal jaw occlusion. No trismus, tenderness, swelling or pain on movement.     Right Ear: Hearing, tympanic membrane, ear canal and external ear normal. There is no impacted cerumen.     Left Ear: Hearing, tympanic membrane, ear canal and external ear normal. There is no impacted cerumen.     Nose: Nose normal. No congestion or rhinorrhea.     Right Turbinates: Not enlarged, swollen or pale.     Left Turbinates:  Not enlarged, swollen or pale.     Right Sinus: No maxillary sinus tenderness or frontal sinus tenderness.     Left Sinus: No maxillary sinus tenderness or frontal sinus tenderness.     Mouth/Throat:     Lips: Pink.     Mouth: Mucous membranes are moist. No injury.     Tongue: No lesions.     Pharynx: Oropharynx is clear. Uvula midline. No pharyngeal swelling, oropharyngeal exudate, posterior oropharyngeal erythema or uvula swelling.     Tonsils: No tonsillar exudate or tonsillar abscesses.  Eyes:     General: Lids are normal. Lids are everted, no foreign bodies appreciated. Vision grossly intact. Gaze aligned appropriately. No allergic shiner or visual field deficit.       Right eye: No discharge.        Left eye: No discharge.     Extraocular Movements: Extraocular movements intact.     Conjunctiva/sclera: Conjunctivae normal.     Right eye: Right conjunctiva is not injected. No exudate.    Left eye: Left conjunctiva is not injected. No exudate.    Pupils: Pupils are equal, round, and reactive to light.  Neck:     Thyroid: No thyroid mass, thyromegaly or thyroid tenderness.     Vascular: No carotid bruit.     Trachea: Trachea normal.  Cardiovascular:     Rate and Rhythm: Normal rate and regular rhythm.     Pulses: Normal pulses.          Carotid pulses are 2+ on the right side and 2+ on the left side.      Radial pulses are 2+ on the right side and 2+ on the left side.       Dorsalis pedis pulses are 2+ on the right side and 2+ on the left side.       Posterior tibial  pulses are 2+ on the right side and 2+ on the left side.     Heart sounds: Normal heart sounds, S1 normal and S2 normal. No murmur heard.    No friction rub. No gallop.  Pulmonary:     Effort: Pulmonary effort is normal. No respiratory distress.     Breath sounds: Normal breath sounds and air entry. No stridor. No wheezing, rhonchi or rales.  Chest:     Chest wall: No tenderness.  Abdominal:     General: Abdomen  is flat. Bowel sounds are normal. There is no distension.     Palpations: Abdomen is soft. There is no mass.     Tenderness: There is no abdominal tenderness. There is no right CVA tenderness, left CVA tenderness, guarding or rebound.     Hernia: No hernia is present.  Genitourinary:    Comments: Exam deferred; denies complaints Musculoskeletal:        General: No swelling, tenderness, deformity or signs of injury. Normal range of motion.     Cervical back: Full passive range of motion without pain, normal range of motion and neck supple. No edema, rigidity or tenderness. No muscular tenderness.     Right lower leg: No edema.     Left lower leg: No edema.  Lymphadenopathy:     Cervical: No cervical adenopathy.     Right cervical: No superficial, deep or posterior cervical adenopathy.    Left cervical: No superficial, deep or posterior cervical adenopathy.  Skin:    General: Skin is warm and dry.     Capillary Refill: Capillary refill takes less than 2 seconds.     Coloration: Skin is not jaundiced or pale.     Findings: No bruising, erythema, lesion or rash.  Neurological:     General: No focal deficit present.     Mental Status: She is alert and oriented to person, place, and time. Mental status is at baseline.     GCS: GCS eye subscore is 4. GCS verbal subscore is 5. GCS motor subscore is 6.     Cranial Nerves: No cranial nerve deficit.     Sensory: Sensation is intact. No sensory deficit.     Motor: Motor function is intact. No weakness.     Coordination: Coordination is intact. Coordination normal.     Gait: Gait is intact. Gait normal.  Psychiatric:        Attention and Perception: Attention and perception normal.        Mood and Affect: Mood and affect normal.        Speech: Speech normal.        Behavior: Behavior normal. Behavior is cooperative.        Thought Content: Thought content normal.        Cognition and Memory: Cognition and memory normal.        Judgment:  Judgment normal.    Depression Screen    11/24/2022    2:20 PM 11/24/2022    2:04 PM  PHQ 2/9 Scores  PHQ - 2 Score 2 0  PHQ- 9 Score 5 0   No results found for any visits on 11/24/22.  Assessment & Plan      Problem List Items Addressed This Visit       Respiratory   Allergic rhinitis    Chronic, stable Request for flonase refills      Relevant Medications   fluticasone (FLONASE) 50 MCG/ACT nasal spray     Digestive   Gastroesophageal  reflux disease without esophagitis    Chronic, stable Request for protonix 40 mg refills      Relevant Medications   pantoprazole (PROTONIX) 40 MG tablet     Other   Annual physical exam - Primary    Things to do to keep yourself healthy  - Exercise at least 30-45 minutes a day, 3-4 days a week.  - Eat a low-fat diet with lots of fruits and vegetables, up to 7-9 servings per day.  - Seatbelts can save your life. Wear them always.  - Smoke detectors on every level of your home, check batteries every year.  - Eye Doctor - have an eye exam every 1-2 years  - Safe sex - if you may be exposed to STDs, use a condom.  - Alcohol -  If you drink, do it moderately, less than 2 drinks per day.  - Health Care Power of Attorney. Choose someone to speak for you if you are not able.  - Depression is common in our stressful world.If you're feeling down or losing interest in things you normally enjoy, please come in for a visit.  - Violence - If anyone is threatening or hurting you, please call immediately.       Relevant Orders   Comprehensive Metabolic Panel (CMET)   TSH   CBC   Hemoglobin A1c   Lipid panel   Encounter for hepatitis C screening test for low risk patient    Low risk screen Treatable, and curable. If left untreated Hep C can lead to cirrhosis and liver failure. Encourage routine testing; recommend repeat testing if risk factors change.       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 Encouraged to repeat breast cancer screening every 1-2 years.       Relevant Orders   MM 3D SCREENING MAMMOGRAM BILATERAL BREAST   Tobacco dependency    Chronic, stable E-cigs Declines cessation efforts or education at this time      Return in about 1 year (around 11/25/2023) for annual examination.    Leilani Merl, FNP, have reviewed all documentation for this visit. The documentation on 11/24/22 for the exam, diagnosis, procedures, and orders are all accurate and complete.  Jacky Kindle, FNP  [facility] [phone] (phone) [phone] (fax)  [facility]

## 2022-11-24 NOTE — Assessment & Plan Note (Signed)
Encourage dental, vision, derm, pt notes she plans to f/u with GYN for ongoing concerns for dysplasia

## 2022-11-24 NOTE — Assessment & Plan Note (Signed)
Chronic, stable Request for flonase refills

## 2022-11-24 NOTE — Patient Instructions (Signed)
Please call and schedule your mammogram:  [facility]  [address], [address] [facility] [place],  [place]  [id number] Get Driving Directions Main: [phone]  Sunday:Closed Monday:7:20 AM - 5:00 PM Tuesday:7:20 AM - 5:00 PM Wednesday:7:20 AM - 5:00 PM Thursday:7:20 AM - 5:00 PM Friday:7:20 AM - 4:30 PM Saturday:Closed  

## 2022-11-24 NOTE — Assessment & Plan Note (Signed)
Due for screening for mammogram, denies breast concerns, provided with phone number to call and schedule appointment for mammogram. Encouraged to repeat breast cancer screening every 1-2 years.  

## 2022-11-24 NOTE — Assessment & Plan Note (Signed)
Chronic, stable E-cigs Declines cessation efforts or education at this time

## 2022-11-24 NOTE — Assessment & Plan Note (Signed)
Low risk screen Treatable, and curable. If left untreated Hep C can lead to cirrhosis and liver failure. Encourage routine testing; recommend repeat testing if risk factors change.  

## 2022-11-24 NOTE — Assessment & Plan Note (Signed)
Chronic, intermittent No current flare Request for antivirals to assist with ongoing concerns; worse in heat/summer per pt report

## 2022-11-25 LAB — COMPREHENSIVE METABOLIC PANEL
ALT: 15 IU/L (ref 0–32)
AST: 18 IU/L (ref 0–40)
Albumin/Globulin Ratio: 2
Albumin: 4.6 g/dL (ref 3.8–4.9)
Alkaline Phosphatase: 52 IU/L (ref 44–121)
BUN/Creatinine Ratio: 12 (ref 9–23)
BUN: 7 mg/dL (ref 6–24)
Bilirubin Total: 0.6 mg/dL (ref 0.0–1.2)
CO2: 24 mmol/L (ref 20–29)
Calcium: 9.6 mg/dL (ref 8.7–10.2)
Chloride: 103 mmol/L (ref 96–106)
Creatinine, Ser: 0.57 mg/dL (ref 0.57–1.00)
Globulin, Total: 2.3 g/dL (ref 1.5–4.5)
Glucose: 101 mg/dL — ABNORMAL HIGH (ref 70–99)
Potassium: 4.1 mmol/L (ref 3.5–5.2)
Sodium: 140 mmol/L (ref 134–144)
Total Protein: 6.9 g/dL (ref 6.0–8.5)
eGFR: 108 mL/min/{1.73_m2} (ref >59)

## 2022-11-25 LAB — CBC
Hematocrit: 36.7 % (ref 34.0–46.6)
Hemoglobin: 12.6 g/dL (ref 11.1–15.9)
MCH: 31.2 pg (ref 26.6–33.0)
MCHC: 34.3 g/dL (ref 31.5–35.7)
MCV: 91 fL (ref 79–97)
Platelets: 348 10*3/uL (ref 150–450)
RBC: 4.04 x10E6/uL (ref 3.77–5.28)
RDW: 11.9 % (ref 11.7–15.4)
WBC: 6.7 10*3/uL (ref 3.4–10.8)

## 2022-11-25 LAB — LIPID PANEL
Chol/HDL Ratio: 3.1 ratio (ref 0.0–4.4)
Cholesterol, Total: 222 mg/dL — ABNORMAL HIGH (ref 100–199)
HDL: 72 mg/dL (ref >39)
LDL Chol Calc (NIH): 137 mg/dL — ABNORMAL HIGH (ref 0–99)
Triglycerides: 76 mg/dL (ref 0–149)
VLDL Cholesterol Cal: 13 mg/dL (ref 5–40)

## 2022-11-25 LAB — HIV ANTIBODY (ROUTINE TESTING W REFLEX): HIV Screen 4th Generation wRfx: NONREACTIVE

## 2022-11-25 LAB — HEMOGLOBIN A1C
Est. average glucose Bld gHb Est-mCnc: 123 mg/dL
Hgb A1c MFr Bld: 5.9 % — ABNORMAL HIGH (ref 4.8–5.6)

## 2022-11-25 LAB — HEPATITIS C ANTIBODY: Hep C Virus Ab: NONREACTIVE

## 2022-11-25 LAB — TSH: TSH: 1.19 u[IU]/mL (ref 0.450–4.500)

## 2022-11-25 NOTE — Progress Notes (Signed)
A1c indicates pre-diabetes; Continue to recommend balanced, lower carb meals. Smaller meal size, adding snacks. Choosing water as drink of choice and increasing purposeful exercise.  LDL/bad cholesterol is improved. However, remains elevated at 137. I continue to recommend smoking reduction of e-cigs, healthy diet low in saturated fats and increase in activity.   Other labs are normal/stable.

## 2023-01-05 ENCOUNTER — Ambulatory Visit: Payer: 59 | Admitting: Obstetrics and Gynecology

## 2023-01-27 ENCOUNTER — Ambulatory Visit (INDEPENDENT_AMBULATORY_CARE_PROVIDER_SITE_OTHER): Payer: 59 | Admitting: Obstetrics and Gynecology

## 2023-01-27 ENCOUNTER — Other Ambulatory Visit (HOSPITAL_COMMUNITY)
Admission: RE | Admit: 2023-01-27 | Discharge: 2023-01-27 | Disposition: A | Discharge status: Home / Self Care | Payer: 59 | Source: Ambulatory Visit | Attending: Obstetrics and Gynecology | Admitting: Obstetrics and Gynecology

## 2023-01-27 VITALS — BP 134/78 | HR 90 | Resp 16 | Ht 60.0 in | Wt 128.5 lb

## 2023-01-27 DIAGNOSIS — N893 Dysplasia of vagina, unspecified: Secondary | ICD-10-CM

## 2023-01-27 DIAGNOSIS — N870 Mild cervical dysplasia: Secondary | ICD-10-CM

## 2023-01-27 DIAGNOSIS — R87811 Vaginal high risk human papillomavirus (HPV) DNA test positive: Secondary | ICD-10-CM

## 2023-01-27 NOTE — Progress Notes (Signed)
      GYNECOLOGY OFFICE COLPOSCOPY PROCEDURE NOTE  52 y.o. [id number] here for colposcopy for low-grade squamous intraepithelial neoplasia (LGSIL - encompassing HPV,mild dysplasia,CIN I) vaginal pap smear on 03/28/2021.  She has a prior history of hysterectomy. She had also been following with Dr. Arvella Merles at [facility] for a history of VAIN and VIN. She has had consultation with GYN [facility] about this in the past. Last visit to GYN was in 2022 with Dr. Adelene Idler of [facility]. Patient following up as she is noting some concern that her VAIN may have returned. Is noting a spot on her perineum/left buttock that feels raised, at the previous site that she had an excision performed, and was worried that it was returning. Discussed role for HPV in dysplasia, need for surveillance.    Patient gave informed written consent, time out was performed.  Placed in lithotomy position. Vulva and vaginal vault were viewed with the colposcope after application of acetic acid.   Colposcopy adequate? Yes  Vulvar and vaginal colposcopies performed with no visible lesions; no corresponding biopsies obtained.  . All specimens were labeled and sent to pathology.  Chaperone was present during entire procedure.  Patient was given post procedure instructions.  Will follow up pathology and manage accordingly; patient will be contacted with results and recommendations.  Routine preventative health maintenance measures emphasized.    Hildred Laser, MD [facility]

## 2023-02-04 LAB — CYTOLOGY - PAP

## 2023-02-16 DIAGNOSIS — H0289 Other specified disorders of eyelid: Secondary | ICD-10-CM | POA: Diagnosis not present

## 2023-02-16 DIAGNOSIS — H04123 Dry eye syndrome of bilateral lacrimal glands: Secondary | ICD-10-CM | POA: Diagnosis not present

## 2023-02-27 ENCOUNTER — Other Ambulatory Visit: Payer: Self-pay | Admitting: Advanced Practice Midwife

## 2023-03-05 ENCOUNTER — Other Ambulatory Visit: Payer: Self-pay | Admitting: Advanced Practice Midwife

## 2023-03-08 ENCOUNTER — Other Ambulatory Visit: Payer: Self-pay

## 2023-03-09 ENCOUNTER — Encounter: Payer: Self-pay | Admitting: Obstetrics and Gynecology

## 2023-03-09 ENCOUNTER — Other Ambulatory Visit: Payer: Self-pay | Admitting: Family Medicine

## 2023-03-09 ENCOUNTER — Encounter: Payer: Self-pay | Admitting: Family Medicine

## 2023-03-09 ENCOUNTER — Encounter: Payer: Self-pay | Admitting: Advanced Practice Midwife

## 2023-03-09 DIAGNOSIS — K219 Gastro-esophageal reflux disease without esophagitis: Secondary | ICD-10-CM

## 2023-03-09 MED ORDER — ESTRADIOL 0.5 MG PO TABS: 0.5000 mg | ORAL_TABLET | Freq: Every day | ORAL | 3 refills | Status: AC

## 2023-03-09 MED ORDER — PANTOPRAZOLE SODIUM 40 MG PO TBEC
40.0000 mg | DELAYED_RELEASE_TABLET | Freq: Every day | ORAL | 3 refills | Status: DC
Start: 2023-03-09 — End: 2024-02-28

## 2023-03-15 DIAGNOSIS — L281 Prurigo nodularis: Secondary | ICD-10-CM | POA: Diagnosis not present

## 2023-04-30 ENCOUNTER — Other Ambulatory Visit: Payer: Self-pay | Admitting: Family Medicine

## 2023-05-03 NOTE — Telephone Encounter (Signed)
03/09/23 #90 3 RF  Requested Prescriptions  Refused Prescriptions Disp Refills   estradiol (ESTRACE) 0.5 MG tablet [Pharmacy Med Name: ESTRADIOL 0.5 MG TABLET] 90 tablet 4    Sig: TAKE 1 TABLET BY MOUTH EVERY DAY     OB/GYN:  Estrogens Failed - 04/30/2023  1:31 PM      Failed - Mammogram is up-to-date per Health Maintenance      Passed - Last BP in normal range    BP Readings from Last 1 Encounters:  01/27/23 134/78         Passed - Valid encounter within last 12 months    Recent Outpatient Visits           5 months ago Annual physical exam   [facility] Jacky Kindle, FNP

## 2024-02-28 ENCOUNTER — Telehealth: Payer: Self-pay | Admitting: Family Medicine

## 2024-02-28 DIAGNOSIS — K219 Gastro-esophageal reflux disease without esophagitis: Secondary | ICD-10-CM

## 2024-02-28 MED ORDER — PANTOPRAZOLE SODIUM 40 MG PO TBEC
40.0000 mg | DELAYED_RELEASE_TABLET | Freq: Every day | ORAL | 0 refills | Status: AC
Start: 2024-02-28 — End: ?

## 2024-02-28 NOTE — Telephone Encounter (Signed)
 LOV 11/24/22  No ROV scheduled  Rx for #30 and request to schedule office visit sent to pharmacy.

## 2024-02-28 NOTE — Telephone Encounter (Signed)
[facility] faxed refill request for the following medications:  pantoprazole (PROTONIX) 40 MG tablet   Please advise.  

## 2024-03-22 ENCOUNTER — Other Ambulatory Visit: Payer: Self-pay | Admitting: Family Medicine

## 2024-03-22 DIAGNOSIS — K219 Gastro-esophageal reflux disease without esophagitis: Secondary | ICD-10-CM

## 2024-03-26 ENCOUNTER — Other Ambulatory Visit: Payer: Self-pay | Admitting: Family Medicine

## 2024-03-26 DIAGNOSIS — K219 Gastro-esophageal reflux disease without esophagitis: Secondary | ICD-10-CM

## 2024-03-27 ENCOUNTER — Other Ambulatory Visit: Payer: Self-pay

## 2024-03-27 ENCOUNTER — Telehealth: Payer: Self-pay | Admitting: Family Medicine

## 2024-03-27 NOTE — Telephone Encounter (Signed)
 Converted

## 2024-03-27 NOTE — Telephone Encounter (Signed)
 [facility] faxed refill request for the following medications:  estradiol  (ESTRACE ) 0.5 MG tablet     Please advise.

## 2024-06-23 ENCOUNTER — Other Ambulatory Visit: Payer: Self-pay | Admitting: Family Medicine

## 2024-06-23 DIAGNOSIS — Z1231 Encounter for screening mammogram for malignant neoplasm of breast: Secondary | ICD-10-CM

## 2024-07-11 ENCOUNTER — Encounter
